# Patient Record
Sex: Male | Born: 1935
Health system: Southern US, Community
[De-identification: ages and names within clinical notes are randomized; demographics above are authoritative.]

## PROBLEM LIST (undated history)

## (undated) DIAGNOSIS — M545 Low back pain, unspecified: Secondary | ICD-10-CM

## (undated) DIAGNOSIS — C4490 Unspecified malignant neoplasm of skin, unspecified: Secondary | ICD-10-CM

## (undated) DIAGNOSIS — N419 Inflammatory disease of prostate, unspecified: Secondary | ICD-10-CM

## (undated) DIAGNOSIS — E7800 Pure hypercholesterolemia, unspecified: Secondary | ICD-10-CM

## (undated) DIAGNOSIS — N200 Calculus of kidney: Secondary | ICD-10-CM

## (undated) DIAGNOSIS — N400 Enlarged prostate without lower urinary tract symptoms: Secondary | ICD-10-CM

## (undated) DIAGNOSIS — I10 Essential (primary) hypertension: Secondary | ICD-10-CM

## (undated) DIAGNOSIS — G8929 Other chronic pain: Secondary | ICD-10-CM

## (undated) DIAGNOSIS — E119 Type 2 diabetes mellitus without complications: Secondary | ICD-10-CM

## (undated) HISTORY — PX: LITHOTRIPSY: SUR834

## (undated) HISTORY — PX: APPENDECTOMY: SHX54

## (undated) HISTORY — PX: LUMBAR DISC SURGERY: SHX700

## (undated) HISTORY — PX: BACK SURGERY: SHX140

## (undated) HISTORY — PX: FRACTURE SURGERY: SHX138

## (undated) HISTORY — PX: HYDROCELE EXCISION: SHX482

## (undated) HISTORY — PX: COLONOSCOPY W/ POLYPECTOMY: SHX1380

## (undated) HISTORY — PX: NASAL RECONSTRUCTION: SHX2069

## (undated) HISTORY — PX: KNEE ARTHROSCOPY: SUR90

---

## 2000-02-18 ENCOUNTER — Inpatient Hospital Stay (HOSPITAL_COMMUNITY): Admission: EM | Admit: 2000-02-18 | Discharge: 2000-02-27 | Payer: Self-pay | Admitting: Family Medicine

## 2000-02-18 ENCOUNTER — Encounter: Payer: Self-pay | Admitting: Emergency Medicine

## 2000-02-20 ENCOUNTER — Encounter: Payer: Self-pay | Admitting: General Surgery

## 2000-02-22 ENCOUNTER — Encounter: Payer: Self-pay | Admitting: General Surgery

## 2004-03-15 ENCOUNTER — Ambulatory Visit (HOSPITAL_COMMUNITY): Admission: RE | Admit: 2004-03-15 | Discharge: 2004-03-15 | Payer: Self-pay | Admitting: Gastroenterology

## 2005-07-26 ENCOUNTER — Encounter: Admission: RE | Admit: 2005-07-26 | Discharge: 2005-07-26 | Payer: Self-pay | Admitting: Orthopaedic Surgery

## 2005-08-12 ENCOUNTER — Encounter: Admission: RE | Admit: 2005-08-12 | Discharge: 2005-08-12 | Payer: Self-pay | Admitting: Orthopaedic Surgery

## 2008-09-21 ENCOUNTER — Emergency Department (HOSPITAL_COMMUNITY): Admission: EM | Admit: 2008-09-21 | Discharge: 2008-09-22 | Payer: Self-pay | Admitting: Emergency Medicine

## 2010-05-26 LAB — POCT I-STAT, CHEM 8
BUN: 27 mg/dL — ABNORMAL HIGH (ref 6–23)
Creatinine, Ser: 1.2 mg/dL (ref 0.4–1.5)
Hemoglobin: 15.3 g/dL (ref 13.0–17.0)
Potassium: 3.7 mEq/L (ref 3.5–5.1)
Sodium: 143 mEq/L (ref 135–145)

## 2011-06-19 DIAGNOSIS — E119 Type 2 diabetes mellitus without complications: Secondary | ICD-10-CM | POA: Diagnosis not present

## 2011-06-19 DIAGNOSIS — E785 Hyperlipidemia, unspecified: Secondary | ICD-10-CM | POA: Diagnosis not present

## 2011-06-19 DIAGNOSIS — I10 Essential (primary) hypertension: Secondary | ICD-10-CM | POA: Diagnosis not present

## 2011-07-30 DIAGNOSIS — H2510 Age-related nuclear cataract, unspecified eye: Secondary | ICD-10-CM | POA: Diagnosis not present

## 2011-07-30 DIAGNOSIS — H5200 Hypermetropia, unspecified eye: Secondary | ICD-10-CM | POA: Diagnosis not present

## 2011-11-01 DIAGNOSIS — E119 Type 2 diabetes mellitus without complications: Secondary | ICD-10-CM | POA: Diagnosis not present

## 2011-11-01 DIAGNOSIS — I10 Essential (primary) hypertension: Secondary | ICD-10-CM | POA: Diagnosis not present

## 2011-11-01 DIAGNOSIS — J309 Allergic rhinitis, unspecified: Secondary | ICD-10-CM | POA: Diagnosis not present

## 2011-11-01 DIAGNOSIS — Z23 Encounter for immunization: Secondary | ICD-10-CM | POA: Diagnosis not present

## 2012-09-01 DIAGNOSIS — H04129 Dry eye syndrome of unspecified lacrimal gland: Secondary | ICD-10-CM | POA: Diagnosis not present

## 2012-09-01 DIAGNOSIS — H2510 Age-related nuclear cataract, unspecified eye: Secondary | ICD-10-CM | POA: Diagnosis not present

## 2012-09-01 DIAGNOSIS — H02059 Trichiasis without entropian unspecified eye, unspecified eyelid: Secondary | ICD-10-CM | POA: Diagnosis not present

## 2012-10-13 DIAGNOSIS — M7040 Prepatellar bursitis, unspecified knee: Secondary | ICD-10-CM | POA: Diagnosis not present

## 2013-04-02 DIAGNOSIS — I10 Essential (primary) hypertension: Secondary | ICD-10-CM | POA: Diagnosis not present

## 2013-04-02 DIAGNOSIS — Z23 Encounter for immunization: Secondary | ICD-10-CM | POA: Diagnosis not present

## 2013-04-02 DIAGNOSIS — Z1331 Encounter for screening for depression: Secondary | ICD-10-CM | POA: Diagnosis not present

## 2013-04-02 DIAGNOSIS — E782 Mixed hyperlipidemia: Secondary | ICD-10-CM | POA: Diagnosis not present

## 2013-04-02 DIAGNOSIS — E119 Type 2 diabetes mellitus without complications: Secondary | ICD-10-CM | POA: Diagnosis not present

## 2013-10-01 DIAGNOSIS — Z1211 Encounter for screening for malignant neoplasm of colon: Secondary | ICD-10-CM | POA: Diagnosis not present

## 2013-10-01 DIAGNOSIS — E119 Type 2 diabetes mellitus without complications: Secondary | ICD-10-CM | POA: Diagnosis not present

## 2013-10-20 DIAGNOSIS — IMO0001 Reserved for inherently not codable concepts without codable children: Secondary | ICD-10-CM | POA: Diagnosis not present

## 2013-10-20 DIAGNOSIS — H2510 Age-related nuclear cataract, unspecified eye: Secondary | ICD-10-CM | POA: Diagnosis not present

## 2013-11-18 DIAGNOSIS — Z23 Encounter for immunization: Secondary | ICD-10-CM | POA: Diagnosis not present

## 2014-01-06 DIAGNOSIS — I10 Essential (primary) hypertension: Secondary | ICD-10-CM | POA: Diagnosis not present

## 2014-01-06 DIAGNOSIS — E119 Type 2 diabetes mellitus without complications: Secondary | ICD-10-CM | POA: Diagnosis not present

## 2014-01-31 DIAGNOSIS — J069 Acute upper respiratory infection, unspecified: Secondary | ICD-10-CM | POA: Diagnosis not present

## 2014-02-02 DIAGNOSIS — M25562 Pain in left knee: Secondary | ICD-10-CM | POA: Diagnosis not present

## 2014-04-08 DIAGNOSIS — J309 Allergic rhinitis, unspecified: Secondary | ICD-10-CM | POA: Diagnosis not present

## 2014-04-08 DIAGNOSIS — L570 Actinic keratosis: Secondary | ICD-10-CM | POA: Diagnosis not present

## 2014-04-08 DIAGNOSIS — Z0000 Encounter for general adult medical examination without abnormal findings: Secondary | ICD-10-CM | POA: Diagnosis not present

## 2014-04-08 DIAGNOSIS — Z125 Encounter for screening for malignant neoplasm of prostate: Secondary | ICD-10-CM | POA: Diagnosis not present

## 2014-04-08 DIAGNOSIS — I10 Essential (primary) hypertension: Secondary | ICD-10-CM | POA: Diagnosis not present

## 2014-04-08 DIAGNOSIS — E119 Type 2 diabetes mellitus without complications: Secondary | ICD-10-CM | POA: Diagnosis not present

## 2014-04-08 DIAGNOSIS — E782 Mixed hyperlipidemia: Secondary | ICD-10-CM | POA: Diagnosis not present

## 2014-04-08 DIAGNOSIS — D126 Benign neoplasm of colon, unspecified: Secondary | ICD-10-CM | POA: Diagnosis not present

## 2014-04-20 DIAGNOSIS — X32XXXA Exposure to sunlight, initial encounter: Secondary | ICD-10-CM | POA: Diagnosis not present

## 2014-04-20 DIAGNOSIS — D225 Melanocytic nevi of trunk: Secondary | ICD-10-CM | POA: Diagnosis not present

## 2014-04-20 DIAGNOSIS — L570 Actinic keratosis: Secondary | ICD-10-CM | POA: Diagnosis not present

## 2014-05-05 DIAGNOSIS — R972 Elevated prostate specific antigen [PSA]: Secondary | ICD-10-CM | POA: Diagnosis not present

## 2014-07-28 DIAGNOSIS — M25562 Pain in left knee: Secondary | ICD-10-CM | POA: Diagnosis not present

## 2014-07-28 DIAGNOSIS — M722 Plantar fascial fibromatosis: Secondary | ICD-10-CM | POA: Diagnosis not present

## 2014-07-29 ENCOUNTER — Other Ambulatory Visit: Payer: Self-pay | Admitting: Surgery

## 2014-07-29 DIAGNOSIS — M25562 Pain in left knee: Secondary | ICD-10-CM

## 2014-08-02 ENCOUNTER — Ambulatory Visit
Admission: RE | Admit: 2014-08-02 | Discharge: 2014-08-02 | Disposition: A | Discharge status: Home / Self Care | Payer: Medicare Other | Source: Ambulatory Visit | Attending: Surgery | Admitting: Surgery

## 2014-08-02 DIAGNOSIS — M7122 Synovial cyst of popliteal space [Baker], left knee: Secondary | ICD-10-CM | POA: Diagnosis not present

## 2014-08-02 DIAGNOSIS — M25562 Pain in left knee: Secondary | ICD-10-CM

## 2014-08-02 DIAGNOSIS — M23322 Other meniscus derangements, posterior horn of medial meniscus, left knee: Secondary | ICD-10-CM | POA: Diagnosis not present

## 2014-08-02 DIAGNOSIS — M7042 Prepatellar bursitis, left knee: Secondary | ICD-10-CM | POA: Diagnosis not present

## 2014-08-02 DIAGNOSIS — M23352 Other meniscus derangements, posterior horn of lateral meniscus, left knee: Secondary | ICD-10-CM | POA: Diagnosis not present

## 2014-08-05 DIAGNOSIS — M25562 Pain in left knee: Secondary | ICD-10-CM | POA: Diagnosis not present

## 2014-08-15 DIAGNOSIS — G8918 Other acute postprocedural pain: Secondary | ICD-10-CM | POA: Diagnosis not present

## 2014-08-15 DIAGNOSIS — S83242D Other tear of medial meniscus, current injury, left knee, subsequent encounter: Secondary | ICD-10-CM | POA: Diagnosis not present

## 2014-08-15 DIAGNOSIS — S83282D Other tear of lateral meniscus, current injury, left knee, subsequent encounter: Secondary | ICD-10-CM | POA: Diagnosis not present

## 2014-08-15 DIAGNOSIS — M2242 Chondromalacia patellae, left knee: Secondary | ICD-10-CM | POA: Diagnosis not present

## 2014-08-15 DIAGNOSIS — M23222 Derangement of posterior horn of medial meniscus due to old tear or injury, left knee: Secondary | ICD-10-CM | POA: Diagnosis not present

## 2014-08-15 DIAGNOSIS — M23242 Derangement of anterior horn of lateral meniscus due to old tear or injury, left knee: Secondary | ICD-10-CM | POA: Diagnosis not present

## 2014-09-06 DIAGNOSIS — Z125 Encounter for screening for malignant neoplasm of prostate: Secondary | ICD-10-CM | POA: Diagnosis not present

## 2014-09-06 DIAGNOSIS — R972 Elevated prostate specific antigen [PSA]: Secondary | ICD-10-CM | POA: Diagnosis not present

## 2014-09-06 DIAGNOSIS — E119 Type 2 diabetes mellitus without complications: Secondary | ICD-10-CM | POA: Diagnosis not present

## 2014-09-06 DIAGNOSIS — I10 Essential (primary) hypertension: Secondary | ICD-10-CM | POA: Diagnosis not present

## 2014-09-20 DIAGNOSIS — M545 Low back pain: Secondary | ICD-10-CM | POA: Diagnosis not present

## 2014-09-22 ENCOUNTER — Ambulatory Visit
Admission: RE | Admit: 2014-09-22 | Discharge: 2014-09-22 | Disposition: A | Discharge status: Home / Self Care | Payer: Medicare Other | Source: Ambulatory Visit | Attending: Orthopaedic Surgery | Admitting: Orthopaedic Surgery

## 2014-09-22 ENCOUNTER — Other Ambulatory Visit: Payer: Self-pay | Admitting: Orthopaedic Surgery

## 2014-09-22 DIAGNOSIS — M4727 Other spondylosis with radiculopathy, lumbosacral region: Secondary | ICD-10-CM | POA: Diagnosis not present

## 2014-09-22 DIAGNOSIS — M545 Low back pain: Secondary | ICD-10-CM

## 2014-09-22 DIAGNOSIS — M4806 Spinal stenosis, lumbar region: Secondary | ICD-10-CM | POA: Diagnosis not present

## 2014-09-22 DIAGNOSIS — M5137 Other intervertebral disc degeneration, lumbosacral region: Secondary | ICD-10-CM | POA: Diagnosis not present

## 2014-10-06 DIAGNOSIS — R972 Elevated prostate specific antigen [PSA]: Secondary | ICD-10-CM | POA: Diagnosis not present

## 2014-10-25 DIAGNOSIS — M545 Low back pain: Secondary | ICD-10-CM | POA: Diagnosis not present

## 2014-10-25 DIAGNOSIS — M4806 Spinal stenosis, lumbar region: Secondary | ICD-10-CM | POA: Diagnosis not present

## 2014-10-25 DIAGNOSIS — M4726 Other spondylosis with radiculopathy, lumbar region: Secondary | ICD-10-CM | POA: Diagnosis not present

## 2014-10-26 DIAGNOSIS — E119 Type 2 diabetes mellitus without complications: Secondary | ICD-10-CM | POA: Diagnosis not present

## 2014-10-26 DIAGNOSIS — H2513 Age-related nuclear cataract, bilateral: Secondary | ICD-10-CM | POA: Diagnosis not present

## 2014-10-31 ENCOUNTER — Encounter (HOSPITAL_COMMUNITY): Payer: Self-pay | Admitting: Emergency Medicine

## 2014-10-31 ENCOUNTER — Emergency Department (HOSPITAL_COMMUNITY): Payer: Medicare Other

## 2014-10-31 ENCOUNTER — Emergency Department (HOSPITAL_COMMUNITY)
Admission: EM | Admit: 2014-10-31 | Discharge: 2014-10-31 | Disposition: A | Discharge status: Home / Self Care | Payer: Medicare Other | Attending: Emergency Medicine | Admitting: Emergency Medicine

## 2014-10-31 DIAGNOSIS — N200 Calculus of kidney: Secondary | ICD-10-CM | POA: Diagnosis not present

## 2014-10-31 DIAGNOSIS — Z87891 Personal history of nicotine dependence: Secondary | ICD-10-CM | POA: Diagnosis not present

## 2014-10-31 DIAGNOSIS — I10 Essential (primary) hypertension: Secondary | ICD-10-CM | POA: Diagnosis not present

## 2014-10-31 DIAGNOSIS — E119 Type 2 diabetes mellitus without complications: Secondary | ICD-10-CM | POA: Insufficient documentation

## 2014-10-31 DIAGNOSIS — Z87438 Personal history of other diseases of male genital organs: Secondary | ICD-10-CM | POA: Insufficient documentation

## 2014-10-31 DIAGNOSIS — R110 Nausea: Secondary | ICD-10-CM | POA: Diagnosis not present

## 2014-10-31 DIAGNOSIS — R109 Unspecified abdominal pain: Secondary | ICD-10-CM | POA: Diagnosis present

## 2014-10-31 DIAGNOSIS — N132 Hydronephrosis with renal and ureteral calculous obstruction: Secondary | ICD-10-CM | POA: Diagnosis not present

## 2014-10-31 HISTORY — DX: Inflammatory disease of prostate, unspecified: N41.9

## 2014-10-31 HISTORY — DX: Essential (primary) hypertension: I10

## 2014-10-31 LAB — COMPREHENSIVE METABOLIC PANEL
ALT: 27 U/L (ref 17–63)
AST: 25 U/L (ref 15–41)
Albumin: 4.7 g/dL (ref 3.5–5.0)
Alkaline Phosphatase: 68 U/L (ref 38–126)
Anion gap: 7 (ref 5–15)
BILIRUBIN TOTAL: 0.6 mg/dL (ref 0.3–1.2)
BUN: 30 mg/dL — AB (ref 6–20)
CALCIUM: 9.3 mg/dL (ref 8.9–10.3)
CO2: 28 mmol/L (ref 22–32)
CREATININE: 1.16 mg/dL (ref 0.61–1.24)
Chloride: 108 mmol/L (ref 101–111)
GFR, EST NON AFRICAN AMERICAN: 59 mL/min — AB (ref >60)
Glucose, Bld: 133 mg/dL — ABNORMAL HIGH (ref 65–99)
Potassium: 4.8 mmol/L (ref 3.5–5.1)
Sodium: 143 mmol/L (ref 135–145)
TOTAL PROTEIN: 7.7 g/dL (ref 6.5–8.1)

## 2014-10-31 LAB — URINALYSIS, ROUTINE W REFLEX MICROSCOPIC
BILIRUBIN URINE: NEGATIVE
Glucose, UA: NEGATIVE mg/dL
KETONES UR: NEGATIVE mg/dL
LEUKOCYTES UA: NEGATIVE
NITRITE: NEGATIVE
PH: 5 (ref 5.0–8.0)
PROTEIN: NEGATIVE mg/dL
Specific Gravity, Urine: 1.029 (ref 1.005–1.030)
UROBILINOGEN UA: 0.2 mg/dL (ref 0.0–1.0)

## 2014-10-31 LAB — CBC
HCT: 42.8 % (ref 39.0–52.0)
Hemoglobin: 14.1 g/dL (ref 13.0–17.0)
MCH: 29.4 pg (ref 26.0–34.0)
MCHC: 32.9 g/dL (ref 30.0–36.0)
MCV: 89.2 fL (ref 78.0–100.0)
PLATELETS: 201 10*3/uL (ref 150–400)
RBC: 4.8 MIL/uL (ref 4.22–5.81)
RDW: 13.3 % (ref 11.5–15.5)
WBC: 9.9 10*3/uL (ref 4.0–10.5)

## 2014-10-31 LAB — URINE MICROSCOPIC-ADD ON

## 2014-10-31 LAB — LIPASE, BLOOD: Lipase: 42 U/L (ref 22–51)

## 2014-10-31 MED ORDER — HYDROCODONE-ACETAMINOPHEN 5-325 MG PO TABS
2.0000 | ORAL_TABLET | Freq: Once | ORAL | Status: AC
  Administered 2014-10-31: 2 via ORAL
  Filled 2014-10-31: qty 2

## 2014-10-31 MED ORDER — HYDROCODONE-ACETAMINOPHEN 5-325 MG PO TABS: 1.0000 | ORAL_TABLET | ORAL | Status: DC | PRN

## 2014-10-31 MED ORDER — TAMSULOSIN HCL 0.4 MG PO CAPS: 0.4000 mg | ORAL_CAPSULE | Freq: Every day | ORAL | Status: DC

## 2014-10-31 NOTE — ED Provider Notes (Signed)
CSN: [id number]     Arrival date & time 10/31/14  1729 History   First MD Initiated Contact with Patient 10/31/14 2040     Chief Complaint  Patient presents with  . Abdominal Pain     (Consider location/radiation/quality/duration/timing/severity/associated sxs/prior Treatment) Patient is a 79 y.o. male presenting with abdominal pain. The history is provided by the patient and the spouse.  Abdominal Pain Pain location:  L flank Pain quality: stabbing   Pain radiates to:  Does not radiate Pain severity:  Moderate Onset quality:  Sudden Duration:  1 day Timing:  Intermittent Progression:  Waxing and waning Chronicity:  New Relieved by:  Nothing Worsened by:  Nothing tried Ineffective treatments:  NSAIDs Associated symptoms: nausea   Associated symptoms: no fever, no hematuria and no vomiting     Past Medical History  Diagnosis Date  . Hypertension   . Diabetes mellitus without complication   . Prostate infection    History reviewed. No pertinent past surgical history. History reviewed. No pertinent family history. Social History  Substance Use Topics  . Smoking status: Former Smoker    Types: Cigarettes  . Smokeless tobacco: None  . Alcohol Use: No    Review of Systems  Constitutional: Negative for fever.  Gastrointestinal: Positive for nausea and abdominal pain. Negative for vomiting.  Genitourinary: Negative for hematuria.  All other systems reviewed and are negative.     Allergies  Review of patient's allergies indicates no known allergies.  Home Medications   Prior to Admission medications   Not on File   BP 160/90 mmHg  Pulse 69  Temp(Src) 97.7 F (36.5 C) (Oral)  Resp 18  SpO2 96% Physical Exam  Constitutional: He is oriented to person, place, and time. He appears well-developed and well-nourished.  Non-toxic appearance. No distress.  HENT:  Head: Normocephalic and atraumatic.  Eyes: Conjunctivae, EOM and lids are normal. Pupils are equal,  round, and reactive to light.  Neck: Normal range of motion. Neck supple. No tracheal deviation present. No thyroid mass present.  Cardiovascular: Normal rate, regular rhythm and normal heart sounds.  Exam reveals no gallop.   No murmur heard. Pulmonary/Chest: Effort normal and breath sounds normal. No stridor. No respiratory distress. He has no decreased breath sounds. He has no wheezes. He has no rhonchi. He has no rales.  Abdominal: Soft. Normal appearance and bowel sounds are normal. He exhibits no distension. There is no tenderness. There is no rebound and no CVA tenderness.  Musculoskeletal: Normal range of motion. He exhibits no edema or tenderness.  Neurological: He is alert and oriented to person, place, and time. He has normal strength. No cranial nerve deficit or sensory deficit. GCS eye subscore is 4. GCS verbal subscore is 5. GCS motor subscore is 6.  Skin: Skin is warm and dry. No abrasion and no rash noted.  Psychiatric: He has a normal mood and affect. His speech is normal and behavior is normal.  Nursing note and vitals reviewed.   ED Course  Procedures (including critical care time) Labs Review Labs Reviewed  COMPREHENSIVE METABOLIC PANEL - Abnormal; Notable for the following:    Glucose, Bld 133 (*)    BUN 30 (*)    GFR calc non Af Amer 59 (*)    All other components within normal limits  URINALYSIS, ROUTINE W REFLEX MICROSCOPIC (NOT AT [facility]) - Abnormal; Notable for the following:    APPearance CLOUDY (*)    Hgb urine dipstick MODERATE (*)  All other components within normal limits  URINE MICROSCOPIC-ADD ON - Abnormal; Notable for the following:    Casts HYALINE CASTS (*)    All other components within normal limits  LIPASE, BLOOD  CBC    Imaging Review No results found. I have personally reviewed and evaluated these images and lab results as part of my medical decision-making.   EKG Interpretation None      MDM   Final diagnoses:  None    Patient  given Vicodin feels better. Has an appointment to be seen by urology on the fifth of . Instructed to call the office to see if he can be seen sooner.    Lacretia Leigh, MD 10/31/14 2248

## 2014-10-31 NOTE — ED Notes (Signed)
Pt c/o left side and abdominal pain onset today at 0400, has been treating with advil which helps mildly. Denies dysuria or urinary symptoms. Pt states he has ongoing prostate infection as well.

## 2014-10-31 NOTE — ED Notes (Signed)
Starting around 0400 pt began feeling a radiating dull pain in his left abdominal area. He states he had 1 episode of emesis, but denies diarrhea. Pt has a hx of a hernia. Pt states the pain comes in waves. His abdomen is tight and appears distended. Pt states he is able to tolerate PO fluids and food. Pt rates his pain at 8/10

## 2014-10-31 NOTE — Progress Notes (Signed)
EDCM spoke to patient at bedside.  Patient confirms his pcp is Dr. Hulan Fess.  System updated.

## 2014-10-31 NOTE — Discharge Instructions (Signed)
Called the urologist tomorrow to schedule a follow-up visit. Tell him that you in the ER and that you have a 3 mm kidney stone on the left side Kidney Stones Kidney stones (urolithiasis) are deposits that form inside your kidneys. The intense pain is caused by the stone moving through the urinary tract. When the stone moves, the ureter goes into spasm around the stone. The stone is usually passed in the urine.  CAUSES   A disorder that makes certain neck glands produce too much parathyroid hormone (primary hyperparathyroidism).  A buildup of uric acid crystals, similar to gout in your joints.  Narrowing (stricture) of the ureter.  A kidney obstruction present at birth (congenital obstruction).  Previous surgery on the kidney or ureters.  Numerous kidney infections. SYMPTOMS   Feeling sick to your stomach (nauseous).  Throwing up (vomiting).  Blood in the urine (hematuria).  Pain that usually spreads (radiates) to the groin.  Frequency or urgency of urination. DIAGNOSIS   Taking a history and physical exam.  Blood or urine tests.  CT scan.  Occasionally, an examination of the inside of the urinary bladder (cystoscopy) is performed. TREATMENT   Observation.  Increasing your fluid intake.  Extracorporeal shock wave lithotripsy--This is a noninvasive procedure that uses shock waves to break up kidney stones.  Surgery may be needed if you have severe pain or persistent obstruction. There are various surgical procedures. Most of the procedures are performed with the use of small instruments. Only small incisions are needed to accommodate these instruments, so recovery time is minimized. The size, location, and chemical composition are all important variables that will determine the proper choice of action for you. Talk to your health care provider to better understand your situation so that you will minimize the risk of injury to yourself and your kidney.  HOME CARE  INSTRUCTIONS   Drink enough water and fluids to keep your urine clear or pale yellow. This will help you to pass the stone or stone fragments.  Strain all urine through the provided strainer. Keep all particulate matter and stones for your health care provider to see. The stone causing the pain may be as small as a grain of salt. It is very important to use the strainer each and every time you pass your urine. The collection of your stone will allow your health care provider to analyze it and verify that a stone has actually passed. The stone analysis will often identify what you can do to reduce the incidence of recurrences.  Only take over-the-counter or prescription medicines for pain, discomfort, or fever as directed by your health care provider.  Make a follow-up appointment with your health care provider as directed.  Get follow-up X-rays if required. The absence of pain does not always mean that the stone has passed. It may have only stopped moving. If the urine remains completely obstructed, it can cause loss of kidney function or even complete destruction of the kidney. It is your responsibility to make sure X-rays and follow-ups are completed. Ultrasounds of the kidney can show blockages and the status of the kidney. Ultrasounds are not associated with any radiation and can be performed easily in a matter of minutes. SEEK MEDICAL CARE IF:  You experience pain that is progressive and unresponsive to any pain medicine you have been prescribed. SEEK IMMEDIATE MEDICAL CARE IF:   Pain cannot be controlled with the prescribed medicine.  You have a fever or shaking chills.  The severity or intensity  of pain increases over 18 hours and is not relieved by pain medicine.  You develop a new onset of abdominal pain.  You feel faint or pass out.  You are unable to urinate. MAKE SURE YOU:   Understand these instructions.  Will watch your condition.  Will get help right away if you are  not doing well or get worse. Document Released: 02/04/2005 Document Revised: 10/07/2012 Document Reviewed: 07/08/2012 [facility] Patient Information 2015 [place], [place]. This information is not intended to replace advice given to you by your health care provider. Make sure you discuss any questions you have with your health care provider.

## 2014-11-15 DIAGNOSIS — Z87442 Personal history of urinary calculi: Secondary | ICD-10-CM | POA: Diagnosis not present

## 2014-11-15 DIAGNOSIS — N400 Enlarged prostate without lower urinary tract symptoms: Secondary | ICD-10-CM | POA: Diagnosis not present

## 2014-11-15 DIAGNOSIS — R972 Elevated prostate specific antigen [PSA]: Secondary | ICD-10-CM | POA: Diagnosis not present

## 2015-01-18 DIAGNOSIS — N400 Enlarged prostate without lower urinary tract symptoms: Secondary | ICD-10-CM | POA: Diagnosis not present

## 2015-01-18 DIAGNOSIS — Z87442 Personal history of urinary calculi: Secondary | ICD-10-CM | POA: Diagnosis not present

## 2015-01-25 DIAGNOSIS — N400 Enlarged prostate without lower urinary tract symptoms: Secondary | ICD-10-CM | POA: Diagnosis not present

## 2015-01-25 DIAGNOSIS — R972 Elevated prostate specific antigen [PSA]: Secondary | ICD-10-CM | POA: Diagnosis not present

## 2015-01-25 DIAGNOSIS — N481 Balanitis: Secondary | ICD-10-CM | POA: Diagnosis not present

## 2015-01-25 DIAGNOSIS — Z87442 Personal history of urinary calculi: Secondary | ICD-10-CM | POA: Diagnosis not present

## 2015-02-06 DIAGNOSIS — L739 Follicular disorder, unspecified: Secondary | ICD-10-CM | POA: Diagnosis not present

## 2015-02-06 DIAGNOSIS — Z23 Encounter for immunization: Secondary | ICD-10-CM | POA: Diagnosis not present

## 2015-04-14 DIAGNOSIS — M4806 Spinal stenosis, lumbar region: Secondary | ICD-10-CM | POA: Diagnosis not present

## 2015-04-14 DIAGNOSIS — M545 Low back pain: Secondary | ICD-10-CM | POA: Diagnosis not present

## 2015-04-14 DIAGNOSIS — M4726 Other spondylosis with radiculopathy, lumbar region: Secondary | ICD-10-CM | POA: Diagnosis not present

## 2015-04-26 DIAGNOSIS — M4806 Spinal stenosis, lumbar region: Secondary | ICD-10-CM | POA: Diagnosis not present

## 2015-04-26 DIAGNOSIS — E782 Mixed hyperlipidemia: Secondary | ICD-10-CM | POA: Diagnosis not present

## 2015-04-26 DIAGNOSIS — I10 Essential (primary) hypertension: Secondary | ICD-10-CM | POA: Diagnosis not present

## 2015-04-26 DIAGNOSIS — R972 Elevated prostate specific antigen [PSA]: Secondary | ICD-10-CM | POA: Diagnosis not present

## 2015-04-26 DIAGNOSIS — Z0000 Encounter for general adult medical examination without abnormal findings: Secondary | ICD-10-CM | POA: Diagnosis not present

## 2015-04-26 DIAGNOSIS — L570 Actinic keratosis: Secondary | ICD-10-CM | POA: Diagnosis not present

## 2015-04-26 DIAGNOSIS — E119 Type 2 diabetes mellitus without complications: Secondary | ICD-10-CM | POA: Diagnosis not present

## 2015-04-26 DIAGNOSIS — D126 Benign neoplasm of colon, unspecified: Secondary | ICD-10-CM | POA: Diagnosis not present

## 2015-05-05 DIAGNOSIS — L570 Actinic keratosis: Secondary | ICD-10-CM | POA: Diagnosis not present

## 2015-05-05 DIAGNOSIS — X32XXXD Exposure to sunlight, subsequent encounter: Secondary | ICD-10-CM | POA: Diagnosis not present

## 2015-05-05 DIAGNOSIS — D225 Melanocytic nevi of trunk: Secondary | ICD-10-CM | POA: Diagnosis not present

## 2015-05-08 ENCOUNTER — Other Ambulatory Visit: Payer: Self-pay | Admitting: Orthopaedic Surgery

## 2015-05-16 ENCOUNTER — Ambulatory Visit (HOSPITAL_COMMUNITY)
Admission: RE | Admit: 2015-05-16 | Discharge: 2015-05-16 | Disposition: A | Discharge status: Home / Self Care | Payer: Medicare Other | Source: Ambulatory Visit | Attending: Orthopaedic Surgery | Admitting: Orthopaedic Surgery

## 2015-05-16 ENCOUNTER — Encounter (HOSPITAL_COMMUNITY)
Admission: RE | Admit: 2015-05-16 | Discharge: 2015-05-16 | Disposition: A | Discharge status: Home / Self Care | Payer: Medicare Other | Source: Ambulatory Visit | Attending: Orthopaedic Surgery | Admitting: Orthopaedic Surgery

## 2015-05-16 ENCOUNTER — Encounter (HOSPITAL_COMMUNITY): Payer: Self-pay

## 2015-05-16 DIAGNOSIS — J984 Other disorders of lung: Secondary | ICD-10-CM | POA: Diagnosis not present

## 2015-05-16 DIAGNOSIS — Z01812 Encounter for preprocedural laboratory examination: Secondary | ICD-10-CM | POA: Diagnosis not present

## 2015-05-16 DIAGNOSIS — M4806 Spinal stenosis, lumbar region: Secondary | ICD-10-CM | POA: Diagnosis not present

## 2015-05-16 DIAGNOSIS — M48061 Spinal stenosis, lumbar region without neurogenic claudication: Secondary | ICD-10-CM

## 2015-05-16 DIAGNOSIS — E119 Type 2 diabetes mellitus without complications: Secondary | ICD-10-CM | POA: Insufficient documentation

## 2015-05-16 DIAGNOSIS — R918 Other nonspecific abnormal finding of lung field: Secondary | ICD-10-CM | POA: Diagnosis not present

## 2015-05-16 DIAGNOSIS — M545 Low back pain: Secondary | ICD-10-CM | POA: Diagnosis present

## 2015-05-16 DIAGNOSIS — Z87891 Personal history of nicotine dependence: Secondary | ICD-10-CM | POA: Insufficient documentation

## 2015-05-16 DIAGNOSIS — Z79899 Other long term (current) drug therapy: Secondary | ICD-10-CM | POA: Insufficient documentation

## 2015-05-16 DIAGNOSIS — Z7984 Long term (current) use of oral hypoglycemic drugs: Secondary | ICD-10-CM | POA: Diagnosis not present

## 2015-05-16 DIAGNOSIS — I10 Essential (primary) hypertension: Secondary | ICD-10-CM | POA: Insufficient documentation

## 2015-05-16 HISTORY — DX: Benign prostatic hyperplasia without lower urinary tract symptoms: N40.0

## 2015-05-16 LAB — COMPREHENSIVE METABOLIC PANEL
ALK PHOS: 69 U/L (ref 38–126)
ALT: 25 U/L (ref 17–63)
ANION GAP: 8 (ref 5–15)
AST: 23 U/L (ref 15–41)
Albumin: 3.7 g/dL (ref 3.5–5.0)
BILIRUBIN TOTAL: 0.6 mg/dL (ref 0.3–1.2)
BUN: 19 mg/dL (ref 6–20)
CALCIUM: 8.8 mg/dL — AB (ref 8.9–10.3)
CO2: 25 mmol/L (ref 22–32)
Chloride: 107 mmol/L (ref 101–111)
Creatinine, Ser: 0.92 mg/dL (ref 0.61–1.24)
Glucose, Bld: 123 mg/dL — ABNORMAL HIGH (ref 65–99)
Potassium: 4.4 mmol/L (ref 3.5–5.1)
Sodium: 140 mmol/L (ref 135–145)
TOTAL PROTEIN: 6.8 g/dL (ref 6.5–8.1)

## 2015-05-16 LAB — GLUCOSE, CAPILLARY: GLUCOSE-CAPILLARY: 106 mg/dL — AB (ref 65–99)

## 2015-05-16 LAB — PROTIME-INR
INR: 1.03 (ref 0.00–1.49)
PROTHROMBIN TIME: 13.7 s (ref 11.6–15.2)

## 2015-05-16 LAB — CBC
HEMATOCRIT: 43.6 % (ref 39.0–52.0)
HEMOGLOBIN: 14.1 g/dL (ref 13.0–17.0)
MCH: 29.4 pg (ref 26.0–34.0)
MCHC: 32.3 g/dL (ref 30.0–36.0)
MCV: 90.8 fL (ref 78.0–100.0)
Platelets: 194 10*3/uL (ref 150–400)
RBC: 4.8 MIL/uL (ref 4.22–5.81)
RDW: 13.1 % (ref 11.5–15.5)
WBC: 7.2 10*3/uL (ref 4.0–10.5)

## 2015-05-16 LAB — SURGICAL PCR SCREEN
MRSA, PCR: POSITIVE — AB
Staphylococcus aureus: POSITIVE — AB

## 2015-05-16 NOTE — Pre-Procedure Instructions (Signed)
Seth Foley  05/16/2015      Your procedure is scheduled on Friday, March 31  Report to [facility] Admitting at 7:50 A.M.               Your surgery or procedure is scheduled for 9:50 AM   Call this number if you have problems the morning of surgery:[phone]                For any other questions, please call [phone], Monday - Friday 8 AM - 4 PM.             Remember:  Do not eat food or drink liquids after midnight on Thrusday,March 30   Take these medicines the morning of surgery with A SIP OF WATER: hydrocodone,systane drops if needed              Stop ibuprofen, advil, motrin, aleve today   How to Manage Your Diabetes Before and After Surgery  WHAT DO I DO ABOUT MY DIABETES MEDICATION?  Marland Kitchen Do not take oral diabetes medicines (pills) the morning of surgery.  Why is it important to control my blood sugar before and after surgery? . Improving blood sugar levels before and after surgery helps healing and can limit problems. . A way of improving blood sugar control is eating a healthy diet by: o  Eating less sugar and carbohydrates o  Increasing activity/exercise o  Talking with your doctor about reaching your blood sugar goals . High blood sugars (greater than 180 mg/dL) can raise your risk of infections and slow your recovery, so you will need to focus on controlling your diabetes during the weeks before surgery. . Make sure that the doctor who takes care of your diabetes knows about your planned surgery including the date and location.  How do I manage my blood sugar before surgery? . Check your blood sugar at least 4 times a day, starting 2 days before surgery, to make sure that the level is not too high or low o Check your blood sugar the morning of your surgery when you wake up and every 2 hours until you get to the Short Stay unit. . If your blood sugar is less than 70 mg/dL, you will need to treat for low blood sugar: o Do not take  insulin. o Treat a low blood sugar (less than 70 mg/dL) with  cup of clear juice (cranberry or apple), 4 glucose tablets, OR glucose gel. o Recheck blood sugar in 15 minutes after treatment (to make sure it is greater than 70 mg/dL). If your blood sugar is not greater than 70 mg/dL on recheck, call [phone] for further instructions. . Report your blood sugar to the short stay nurse when you get to Short Stay.  . If you are admitted to the hospital after surgery: o Your blood sugar will be checked by the staff and you will probably be given insulin after surgery (instead of oral diabetes medicines) to make sure you have good blood sugar levels. o The goal for blood sugar control after surgery is 80-180.   Do not wear jewelry, make-up or nail polish.  Do not wear lotions, powders, or perfumes.  You may wear deodorant.  Do not shave 48 hours prior to surgery.  Men may shave face and neck.  Do not bring valuables to the hospital.  [facility] is not responsible for any belongings or valuables.  Contacts, dentures or bridgework may not be  worn into surgery.  Leave your suitcase in the car.  After surgery it may be brought to your room.  For patients admitted to the hospital, discharge time will be determined by your treatment team.  Special instructions: Review  [facility] - Preparing For Surgery.  Please read over the following fact sheets that you were given. Pain Booklet, Coughing and Deep Breathing and Surgical Site Infection Prevention

## 2015-05-16 NOTE — Progress Notes (Signed)
Mr Larios denies chest pain or any history of heart disease, does not see a cardiologist.  PCP is Dr Hulan Fess.  Patient reports that he had an EKG and Labs drawn earlier in March.  Patient reports that A1C was 6. Something, "I know it was lower than 7".  I will request records from Dr Eddie Dibbles office.

## 2015-05-19 ENCOUNTER — Encounter (HOSPITAL_COMMUNITY)
Admission: RE | Disposition: A | Discharge status: Home / Self Care | Payer: Self-pay | Source: Ambulatory Visit | Attending: Orthopaedic Surgery

## 2015-05-19 ENCOUNTER — Inpatient Hospital Stay (HOSPITAL_COMMUNITY): Payer: Medicare Other | Admitting: Certified Registered"

## 2015-05-19 ENCOUNTER — Inpatient Hospital Stay (HOSPITAL_COMMUNITY): Payer: Medicare Other

## 2015-05-19 ENCOUNTER — Observation Stay (HOSPITAL_COMMUNITY)
Admission: RE | Admit: 2015-05-19 | Discharge: 2015-05-20 | Disposition: A | Discharge status: Home / Self Care | Payer: Medicare Other | Source: Ambulatory Visit | Attending: Orthopaedic Surgery | Admitting: Orthopaedic Surgery

## 2015-05-19 ENCOUNTER — Encounter (HOSPITAL_COMMUNITY): Payer: Self-pay | Admitting: *Deleted

## 2015-05-19 DIAGNOSIS — Z85828 Personal history of other malignant neoplasm of skin: Secondary | ICD-10-CM | POA: Insufficient documentation

## 2015-05-19 DIAGNOSIS — I10 Essential (primary) hypertension: Secondary | ICD-10-CM | POA: Diagnosis not present

## 2015-05-19 DIAGNOSIS — Z885 Allergy status to narcotic agent status: Secondary | ICD-10-CM | POA: Diagnosis not present

## 2015-05-19 DIAGNOSIS — Z87891 Personal history of nicotine dependence: Secondary | ICD-10-CM | POA: Diagnosis not present

## 2015-05-19 DIAGNOSIS — Z87442 Personal history of urinary calculi: Secondary | ICD-10-CM | POA: Insufficient documentation

## 2015-05-19 DIAGNOSIS — N400 Enlarged prostate without lower urinary tract symptoms: Secondary | ICD-10-CM | POA: Diagnosis not present

## 2015-05-19 DIAGNOSIS — Z79899 Other long term (current) drug therapy: Secondary | ICD-10-CM | POA: Insufficient documentation

## 2015-05-19 DIAGNOSIS — Z9889 Other specified postprocedural states: Secondary | ICD-10-CM

## 2015-05-19 DIAGNOSIS — Z419 Encounter for procedure for purposes other than remedying health state, unspecified: Secondary | ICD-10-CM

## 2015-05-19 DIAGNOSIS — M5136 Other intervertebral disc degeneration, lumbar region: Principal | ICD-10-CM | POA: Insufficient documentation

## 2015-05-19 DIAGNOSIS — M4806 Spinal stenosis, lumbar region: Secondary | ICD-10-CM | POA: Insufficient documentation

## 2015-05-19 DIAGNOSIS — Z7984 Long term (current) use of oral hypoglycemic drugs: Secondary | ICD-10-CM | POA: Insufficient documentation

## 2015-05-19 DIAGNOSIS — E119 Type 2 diabetes mellitus without complications: Secondary | ICD-10-CM | POA: Diagnosis not present

## 2015-05-19 DIAGNOSIS — M545 Low back pain: Secondary | ICD-10-CM | POA: Diagnosis not present

## 2015-05-19 HISTORY — PX: DECOMPRESSIVE LUMBAR LAMINECTOMY LEVEL 2: SHX5792

## 2015-05-19 HISTORY — DX: Pure hypercholesterolemia, unspecified: E78.00

## 2015-05-19 HISTORY — DX: Low back pain: M54.5

## 2015-05-19 HISTORY — DX: Calculus of kidney: N20.0

## 2015-05-19 HISTORY — DX: Unspecified malignant neoplasm of skin, unspecified: C44.90

## 2015-05-19 HISTORY — DX: Type 2 diabetes mellitus without complications: E11.9

## 2015-05-19 HISTORY — DX: Low back pain, unspecified: M54.50

## 2015-05-19 HISTORY — DX: Other chronic pain: G89.29

## 2015-05-19 HISTORY — PX: LUMBAR LAMINECTOMY/DECOMPRESSION MICRODISCECTOMY: SHX5026

## 2015-05-19 LAB — GLUCOSE, CAPILLARY
GLUCOSE-CAPILLARY: 137 mg/dL — AB (ref 65–99)
Glucose-Capillary: 131 mg/dL — ABNORMAL HIGH (ref 65–99)
Glucose-Capillary: 137 mg/dL — ABNORMAL HIGH (ref 65–99)
Glucose-Capillary: 126 mg/dL — ABNORMAL HIGH (ref 65–99)

## 2015-05-19 SURGERY — LUMBAR LAMINECTOMY/DECOMPRESSION MICRODISCECTOMY
Anesthesia: General

## 2015-05-19 MED ORDER — CEFAZOLIN SODIUM 1-5 GM-% IV SOLN
1.0000 g | Freq: Three times a day (TID) | INTRAVENOUS | Status: AC
  Administered 2015-05-20: 1 g via INTRAVENOUS
  Filled 2015-05-19 (×2): qty 50

## 2015-05-19 MED ORDER — FENTANYL CITRATE (PF) 100 MCG/2ML IJ SOLN
INTRAMUSCULAR | Status: DC | PRN
  Administered 2015-05-19: 50 ug via INTRAVENOUS
  Administered 2015-05-19 (×2): 25 ug via INTRAVENOUS
  Administered 2015-05-19: 150 ug via INTRAVENOUS

## 2015-05-19 MED ORDER — METFORMIN HCL 500 MG PO TABS
500.0000 mg | ORAL_TABLET | Freq: Every day | ORAL | Status: DC
  Administered 2015-05-20: 500 mg via ORAL
  Filled 2015-05-19: qty 1

## 2015-05-19 MED ORDER — POLYETHYLENE GLYCOL 3350 17 G PO PACK: 17.0000 g | PACK | Freq: Every day | ORAL | Status: DC | PRN

## 2015-05-19 MED ORDER — LACTATED RINGERS IV SOLN
INTRAVENOUS | Status: DC
  Administered 2015-05-19 (×3): via INTRAVENOUS

## 2015-05-19 MED ORDER — GLYCOPYRROLATE 0.2 MG/ML IJ SOLN
INTRAMUSCULAR | Status: DC | PRN
  Administered 2015-05-19: 0.6 mg via INTRAVENOUS

## 2015-05-19 MED ORDER — SODIUM CHLORIDE 0.9 % IV SOLN: 250.0000 mL | INTRAVENOUS | Status: DC

## 2015-05-19 MED ORDER — ONDANSETRON HCL 4 MG/2ML IJ SOLN
INTRAMUSCULAR | Status: DC | PRN
  Administered 2015-05-19: 4 mg via INTRAVENOUS

## 2015-05-19 MED ORDER — METHOCARBAMOL 500 MG PO TABS: 500.0000 mg | ORAL_TABLET | Freq: Four times a day (QID) | ORAL | Status: DC | PRN

## 2015-05-19 MED ORDER — OXYCODONE-ACETAMINOPHEN 5-325 MG PO TABS
ORAL_TABLET | ORAL | Status: AC
  Filled 2015-05-19: qty 2

## 2015-05-19 MED ORDER — FENTANYL CITRATE (PF) 250 MCG/5ML IJ SOLN
INTRAMUSCULAR | Status: AC
  Filled 2015-05-19: qty 5

## 2015-05-19 MED ORDER — HYDROMORPHONE HCL 1 MG/ML IJ SOLN: 0.5000 mg | INTRAMUSCULAR | Status: DC | PRN

## 2015-05-19 MED ORDER — ROCURONIUM BROMIDE 50 MG/5ML IV SOLN
INTRAVENOUS | Status: AC
  Filled 2015-05-19: qty 1

## 2015-05-19 MED ORDER — VANCOMYCIN HCL IN DEXTROSE 1-5 GM/200ML-% IV SOLN
1000.0000 mg | Freq: Once | INTRAVENOUS | Status: AC
  Administered 2015-05-19: 1000 mg via INTRAVENOUS

## 2015-05-19 MED ORDER — MEPERIDINE HCL 25 MG/ML IJ SOLN: 6.2500 mg | INTRAMUSCULAR | Status: DC | PRN

## 2015-05-19 MED ORDER — LOSARTAN POTASSIUM 50 MG PO TABS
100.0000 mg | ORAL_TABLET | Freq: Every day | ORAL | Status: DC
  Administered 2015-05-19 – 2015-05-20 (×2): 100 mg via ORAL
  Filled 2015-05-19: qty 2

## 2015-05-19 MED ORDER — ARTIFICIAL TEARS OP OINT
TOPICAL_OINTMENT | OPHTHALMIC | Status: DC | PRN
  Administered 2015-05-19: 1 via OPHTHALMIC

## 2015-05-19 MED ORDER — DOCUSATE SODIUM 100 MG PO CAPS
100.0000 mg | ORAL_CAPSULE | Freq: Two times a day (BID) | ORAL | Status: DC
  Administered 2015-05-19 – 2015-05-20 (×2): 100 mg via ORAL
  Filled 2015-05-19 (×2): qty 1

## 2015-05-19 MED ORDER — NEOSTIGMINE METHYLSULFATE 10 MG/10ML IV SOLN
INTRAVENOUS | Status: AC
  Filled 2015-05-19: qty 1

## 2015-05-19 MED ORDER — PHENYLEPHRINE 40 MCG/ML (10ML) SYRINGE FOR IV PUSH (FOR BLOOD PRESSURE SUPPORT)
PREFILLED_SYRINGE | INTRAVENOUS | Status: AC
  Filled 2015-05-19: qty 10

## 2015-05-19 MED ORDER — ONDANSETRON HCL 4 MG/2ML IJ SOLN
4.0000 mg | Freq: Once | INTRAMUSCULAR | Status: DC | PRN
Start: 2015-05-19 — End: 2015-05-19

## 2015-05-19 MED ORDER — METHOCARBAMOL 1000 MG/10ML IJ SOLN
500.0000 mg | Freq: Four times a day (QID) | INTRAVENOUS | Status: DC | PRN
  Filled 2015-05-19: qty 5

## 2015-05-19 MED ORDER — SIMVASTATIN 20 MG PO TABS
20.0000 mg | ORAL_TABLET | Freq: Every day | ORAL | Status: DC
Start: 2015-05-19 — End: 2015-05-20
  Administered 2015-05-19: 20 mg via ORAL
  Filled 2015-05-19: qty 1

## 2015-05-19 MED ORDER — LIDOCAINE HCL (CARDIAC) 20 MG/ML IV SOLN
INTRAVENOUS | Status: AC
  Filled 2015-05-19: qty 5

## 2015-05-19 MED ORDER — ARTIFICIAL TEARS OP OINT
TOPICAL_OINTMENT | OPHTHALMIC | Status: AC
  Filled 2015-05-19: qty 3.5

## 2015-05-19 MED ORDER — KETOROLAC TROMETHAMINE 30 MG/ML IJ SOLN
INTRAMUSCULAR | Status: AC
  Filled 2015-05-19: qty 1

## 2015-05-19 MED ORDER — LIDOCAINE HCL (CARDIAC) 20 MG/ML IV SOLN
INTRAVENOUS | Status: DC | PRN
  Administered 2015-05-19: 100 mg via INTRAVENOUS

## 2015-05-19 MED ORDER — ROCURONIUM BROMIDE 100 MG/10ML IV SOLN
INTRAVENOUS | Status: DC | PRN
  Administered 2015-05-19: 40 mg via INTRAVENOUS

## 2015-05-19 MED ORDER — MENTHOL 3 MG MT LOZG: 1.0000 | LOZENGE | OROMUCOSAL | Status: DC | PRN

## 2015-05-19 MED ORDER — KETOROLAC TROMETHAMINE 30 MG/ML IJ SOLN
30.0000 mg | Freq: Once | INTRAMUSCULAR | Status: AC
Start: 2015-05-19 — End: 2015-05-19
  Administered 2015-05-19: 30 mg via INTRAVENOUS

## 2015-05-19 MED ORDER — ACETAMINOPHEN 650 MG RE SUPP: 650.0000 mg | RECTAL | Status: DC | PRN

## 2015-05-19 MED ORDER — BUPIVACAINE HCL (PF) 0.25 % IJ SOLN
INTRAMUSCULAR | Status: AC
  Filled 2015-05-19: qty 30

## 2015-05-19 MED ORDER — ONDANSETRON HCL 4 MG/2ML IJ SOLN
INTRAMUSCULAR | Status: AC
  Filled 2015-05-19: qty 2

## 2015-05-19 MED ORDER — NEOSTIGMINE METHYLSULFATE 10 MG/10ML IV SOLN
INTRAVENOUS | Status: DC | PRN
  Administered 2015-05-19: 4 mg via INTRAVENOUS

## 2015-05-19 MED ORDER — PROPOFOL 10 MG/ML IV BOLUS
INTRAVENOUS | Status: AC
  Filled 2015-05-19: qty 20

## 2015-05-19 MED ORDER — VANCOMYCIN HCL IN DEXTROSE 1-5 GM/200ML-% IV SOLN
INTRAVENOUS | Status: AC
  Administered 2015-05-19: 1000 mg via INTRAVENOUS
  Filled 2015-05-19: qty 200

## 2015-05-19 MED ORDER — GLYCOPYRROLATE 0.2 MG/ML IJ SOLN
INTRAMUSCULAR | Status: AC
  Filled 2015-05-19: qty 3

## 2015-05-19 MED ORDER — ACETAMINOPHEN 325 MG PO TABS: 650.0000 mg | ORAL_TABLET | ORAL | Status: DC | PRN

## 2015-05-19 MED ORDER — CEFAZOLIN SODIUM-DEXTROSE 2-4 GM/100ML-% IV SOLN
2.0000 g | INTRAVENOUS | Status: DC
  Filled 2015-05-19: qty 100

## 2015-05-19 MED ORDER — OXYCODONE-ACETAMINOPHEN 5-325 MG PO TABS: 1.0000 | ORAL_TABLET | Freq: Four times a day (QID) | ORAL | Status: DC | PRN

## 2015-05-19 MED ORDER — SODIUM CHLORIDE 0.9% FLUSH: 3.0000 mL | Freq: Two times a day (BID) | INTRAVENOUS | Status: DC

## 2015-05-19 MED ORDER — SODIUM CHLORIDE 0.45 % IV SOLN
INTRAVENOUS | Status: DC
  Administered 2015-05-19: 21:00:00 via INTRAVENOUS

## 2015-05-19 MED ORDER — CHLORHEXIDINE GLUCONATE 4 % EX LIQD: 60.0000 mL | Freq: Once | CUTANEOUS | Status: DC

## 2015-05-19 MED ORDER — PHENOL 1.4 % MT LIQD: 1.0000 | OROMUCOSAL | Status: DC | PRN

## 2015-05-19 MED ORDER — BUPIVACAINE HCL (PF) 0.25 % IJ SOLN
INTRAMUSCULAR | Status: DC | PRN
  Administered 2015-05-19: 10 mL

## 2015-05-19 MED ORDER — PROPOFOL 10 MG/ML IV BOLUS
INTRAVENOUS | Status: DC | PRN
  Administered 2015-05-19: 150 mg via INTRAVENOUS

## 2015-05-19 MED ORDER — FENTANYL CITRATE (PF) 100 MCG/2ML IJ SOLN
INTRAMUSCULAR | Status: AC
  Filled 2015-05-19: qty 2

## 2015-05-19 MED ORDER — FENTANYL CITRATE (PF) 100 MCG/2ML IJ SOLN
25.0000 ug | INTRAMUSCULAR | Status: DC | PRN
  Administered 2015-05-19 (×4): 25 ug via INTRAVENOUS

## 2015-05-19 MED ORDER — 0.9 % SODIUM CHLORIDE (POUR BTL) OPTIME
TOPICAL | Status: DC | PRN
  Administered 2015-05-19: 1000 mL

## 2015-05-19 MED ORDER — ONDANSETRON HCL 4 MG/2ML IJ SOLN: 4.0000 mg | INTRAMUSCULAR | Status: DC | PRN

## 2015-05-19 MED ORDER — OXYCODONE-ACETAMINOPHEN 5-325 MG PO TABS
1.0000 | ORAL_TABLET | Freq: Four times a day (QID) | ORAL | Status: DC | PRN
  Administered 2015-05-19 – 2015-05-20 (×3): 2 via ORAL
  Filled 2015-05-19 (×2): qty 2

## 2015-05-19 MED ORDER — SODIUM CHLORIDE 0.9% FLUSH: 3.0000 mL | INTRAVENOUS | Status: DC | PRN

## 2015-05-19 SURGICAL SUPPLY — 49 items
ADH SKN CLS APL DERMABOND .7 (GAUZE/BANDAGES/DRESSINGS) ×2
BUR ROUND FLUTED 4 SOFT TCH (BURR) ×1 IMPLANT
BUR ROUND FLUTED 4MM SOFT TCH (BURR) ×1
CLOSURE STERI-STRIP 1/2X4 (GAUZE/BANDAGES/DRESSINGS) ×1
CLSR STERI-STRIP ANTIMIC 1/2X4 (GAUZE/BANDAGES/DRESSINGS) ×2 IMPLANT
COVER SURGICAL LIGHT HANDLE (MISCELLANEOUS) ×3 IMPLANT
DERMABOND ADVANCED (GAUZE/BANDAGES/DRESSINGS) ×4
DERMABOND ADVANCED .7 DNX12 (GAUZE/BANDAGES/DRESSINGS) ×1 IMPLANT
DRAPE MICROSCOPE LEICA (MISCELLANEOUS) ×3 IMPLANT
DRAPE PROXIMA HALF (DRAPES) ×6 IMPLANT
DRSG MEPILEX BORDER 4X4 (GAUZE/BANDAGES/DRESSINGS) ×1 IMPLANT
DRSG MEPILEX BORDER 4X8 (GAUZE/BANDAGES/DRESSINGS) ×3 IMPLANT
DURAPREP 26ML APPLICATOR (WOUND CARE) ×3 IMPLANT
DURASEAL SPINE SEALANT 3ML (MISCELLANEOUS) ×2 IMPLANT
ELECT REM PT RETURN 9FT ADLT (ELECTROSURGICAL) ×3
ELECTRODE REM PT RTRN 9FT ADLT (ELECTROSURGICAL) ×1 IMPLANT
GLOVE BIOGEL PI IND STRL 8 (GLOVE) ×2 IMPLANT
GLOVE BIOGEL PI INDICATOR 8 (GLOVE) ×4
GLOVE ORTHO TXT STRL SZ7.5 (GLOVE) ×6 IMPLANT
GOWN STRL REUS W/ TWL LRG LVL3 (GOWN DISPOSABLE) ×2 IMPLANT
GOWN STRL REUS W/ TWL XL LVL3 (GOWN DISPOSABLE) ×1 IMPLANT
GOWN STRL REUS W/TWL 2XL LVL3 (GOWN DISPOSABLE) ×3 IMPLANT
GOWN STRL REUS W/TWL LRG LVL3 (GOWN DISPOSABLE) ×6
GOWN STRL REUS W/TWL XL LVL3 (GOWN DISPOSABLE) ×3
KIT BASIN OR (CUSTOM PROCEDURE TRAY) ×3 IMPLANT
KIT ROOM TURNOVER OR (KITS) ×3 IMPLANT
MANIFOLD NEPTUNE II (INSTRUMENTS) ×3 IMPLANT
NDL HYPO 25GX1X1/2 BEV (NEEDLE) ×1 IMPLANT
NDL SPNL 18GX3.5 QUINCKE PK (NEEDLE) ×1 IMPLANT
NEEDLE HYPO 25GX1X1/2 BEV (NEEDLE) ×3 IMPLANT
NEEDLE SPNL 18GX3.5 QUINCKE PK (NEEDLE) ×3 IMPLANT
NS IRRIG 1000ML POUR BTL (IV SOLUTION) ×3 IMPLANT
PACK LAMINECTOMY ORTHO (CUSTOM PROCEDURE TRAY) ×3 IMPLANT
PAD ARMBOARD 7.5X6 YLW CONV (MISCELLANEOUS) ×6 IMPLANT
PATTIES SURGICAL .5 X.5 (GAUZE/BANDAGES/DRESSINGS) ×2 IMPLANT
PATTIES SURGICAL .75X.75 (GAUZE/BANDAGES/DRESSINGS) IMPLANT
SPONGE LAP 4X18 X RAY DECT (DISPOSABLE) ×2 IMPLANT
SUT BONE WAX W31G (SUTURE) IMPLANT
SUT NURALON 4 0 TR CR/8 (SUTURE) ×2 IMPLANT
SUT VIC AB 0 CT1 27 (SUTURE) ×3
SUT VIC AB 0 CT1 27XBRD ANBCTR (SUTURE) ×1 IMPLANT
SUT VIC AB 1 CT1 27 (SUTURE) ×3
SUT VIC AB 1 CT1 27XBRD ANBCTR (SUTURE) IMPLANT
SUT VIC AB 2-0 CT1 27 (SUTURE) ×3
SUT VIC AB 2-0 CT1 TAPERPNT 27 (SUTURE) ×1 IMPLANT
SUT VIC AB 3-0 X1 27 (SUTURE) IMPLANT
TOWEL OR 17X24 6PK STRL BLUE (TOWEL DISPOSABLE) ×3 IMPLANT
TOWEL OR 17X26 10 PK STRL BLUE (TOWEL DISPOSABLE) ×3 IMPLANT
WATER STERILE IRR 1000ML POUR (IV SOLUTION) ×3 IMPLANT

## 2015-05-19 NOTE — Transfer of Care (Signed)
Immediate Anesthesia Transfer of Care Note  Patient: Seth Foley  Procedure(s) Performed: Procedure(s): L3-4, L4-5 Decompression (N/A)  Patient Location: PACU  Anesthesia Type:General  Level of Consciousness: awake, alert  and oriented  Airway & Oxygen Therapy: Patient Spontanous Breathing and Patient connected to nasal cannula oxygen  Post-op Assessment: Report given to RN, Post -op Vital signs reviewed and stable and Patient moving all extremities X 4  Post vital signs: Reviewed and stable  Last Vitals:  Filed Vitals:   05/19/15 0754  BP: 149/84  Pulse: 61  Temp: 36.8 C  Resp: 18    Complications: No apparent anesthesia complications

## 2015-05-19 NOTE — Anesthesia Preprocedure Evaluation (Signed)
Anesthesia Evaluation  Patient identified by MRN, date of birth, ID band Patient awake    Reviewed: Allergy & Precautions, H&P , NPO status , Patient's Chart, lab work & pertinent test results  Airway Mallampati: I  TM Distance: >3 FB Neck ROM: full    Dental  (+) Edentulous Lower   Pulmonary neg pulmonary ROS, former smoker,    Pulmonary exam normal        Cardiovascular hypertension, Pt. on medications  Rhythm:regular     Neuro/Psych negative neurological ROS  negative psych ROS   GI/Hepatic negative GI ROS, Neg liver ROS,   Endo/Other  diabetes, Type 2  Renal/GU negative Renal ROS     Musculoskeletal   Abdominal Normal abdominal exam  (+)   Peds  Hematology negative hematology ROS (+)   Anesthesia Other Findings   Reproductive/Obstetrics negative OB ROS                             Anesthesia Physical Anesthesia Plan  ASA: II  Anesthesia Plan: General   Post-op Pain Management:    Induction: Intravenous  Airway Management Planned: Oral ETT  Additional Equipment:   Intra-op Plan:   Post-operative Plan: Extubation in OR  Informed Consent: I have reviewed the patients History and Physical, chart, labs and discussed the procedure including the risks, benefits and alternatives for the proposed anesthesia with the patient or authorized representative who has indicated his/her understanding and acceptance.   Dental Advisory Given  Plan Discussed with: CRNA and Surgeon  Anesthesia Plan Comments:         Anesthesia Quick Evaluation

## 2015-05-19 NOTE — Anesthesia Procedure Notes (Signed)
Procedure Name: Intubation Date/Time: 05/19/2015 11:06 AM Performed by: Gaylene Brooks Pre-anesthesia Checklist: Patient identified, Emergency Drugs available, Suction available and Patient being monitored Patient Re-evaluated:Patient Re-evaluated prior to inductionOxygen Delivery Method: Circle System Utilized Preoxygenation: Pre-oxygenation with 100% oxygen Intubation Type: IV induction Ventilation: Mask ventilation without difficulty Laryngoscope Size: Miller and 2 Grade View: Grade I Tube type: Oral Tube size: 7.5 mm Number of attempts: 1 Airway Equipment and Method: Stylet and Oral airway Placement Confirmation: ETT inserted through vocal cords under direct vision,  positive ETCO2 and breath sounds checked- equal and bilateral Secured at: 21 cm Tube secured with: Tape Dental Injury: Teeth and Oropharynx as per pre-operative assessment

## 2015-05-19 NOTE — Interval H&P Note (Signed)
History and Physical Interval Note:  05/19/2015 10:01 AM  Seth Foley  has presented today for surgery, with the diagnosis of L3-4, L4-5 Decompression  The various methods of treatment have been discussed with the patient and family. After consideration of risks, benefits and other options for treatment, the patient has consented to  Procedure(s): L3-4, L4-5 Decompression (N/A) as a surgical intervention .  The patient's history has been reviewed, patient examined, no change in status, stable for surgery.  I have reviewed the patient's chart and labs.  Questions were answered to the patient's satisfaction.     Riordan Walle C

## 2015-05-19 NOTE — Brief Op Note (Signed)
05/19/2015  1:50 PM  PATIENT:  Seth Foley  80 y.o. male  PRE-OPERATIVE DIAGNOSIS:  L3-4, L4-5 Decompression  POST-OPERATIVE DIAGNOSIS:  L3-4, L4-5 Decompression  PROCEDURE:  Procedure(s): L3-4, L4-5 Decompression (N/A)  SURGEON:  Surgeon(s) and Role:    * Marybelle Killings, MD - Primary  PHYSICIAN ASSISTANT: Benjiman Core pa-c    ANESTHESIA:   general  EBL:  Total I/O In: 1700 [I.V.:1700] Out: 100 [Blood:100]  BLOOD ADMINISTERED:none  DRAINS: none   LOCAL MEDICATIONS USED:  MARCAINE     SPECIMEN:  No Specimen  DISPOSITION OF SPECIMEN:  N/A  COUNTS:  YES  TOURNIQUET:  * No tourniquets in log *  DICTATION: .Dragon Dictation  PLAN OF CARE: Admit for overnight observation  PATIENT DISPOSITION:  PACU - hemodynamically stable.

## 2015-05-19 NOTE — Anesthesia Postprocedure Evaluation (Deleted)
Anesthesia Post Note  Patient: Seth Foley  Procedure(s) Performed: Procedure(s) (LRB): L3-4, L4-5 Decompression (N/A)  Patient location during evaluation: PACU Anesthesia Type: General Level of consciousness: sedated Vital Signs Assessment: post-procedure vital signs reviewed and stable Respiratory status: spontaneous breathing Cardiovascular status: stable Postop Assessment: no signs of nausea or vomiting Anesthetic complications: no    Last Vitals:  Filed Vitals:   05/19/15 1345 05/19/15 1400  BP: 125/70 87/62  Pulse: 60 59  Temp:    Resp: 20 16    Last Pain:  Filed Vitals:   05/19/15 1412  PainSc: 10-Worst pain ever                 Tearia Gibbs JR,JOHN Verdean Murin

## 2015-05-19 NOTE — H&P (Signed)
Seth Foley is an 80 y.o. male.   Chief Complaint:  Low back pain and leg pain HPI:  Patient with hx of L3-4 and L4-5 stenosis presents with above complaint.  Progressively worsening symptoms.  Failed conservative treatment.    Past Medical History  Diagnosis Date  . Hypertension   . Prostate infection   . History of kidney stones     pass  . Diabetes mellitus without complication (HCC)     Type II  . BPH (benign prostatic hyperplasia)   . Cancer ([place])     skin cancer - side of head-     Past Surgical History  Procedure Laterality Date  . Hydrocele excision    . Nasal reconstruction    . Appendectomy    . Back surgery      disc  . Colonoscopy w/ polypectomy    . Knee arthroscopy Bilateral     No family history on file. Social History:  reports that he has quit smoking. His smoking use included Cigarettes. He does not have any smokeless tobacco history on file. He reports that he does not drink alcohol or use illicit drugs.  Allergies:  Allergies  Allergen Reactions  . Morphine And Related Rash    Medications Prior to Admission  Medication Sig Dispense Refill  . ibuprofen (ADVIL,MOTRIN) 200 MG tablet Take 600 mg by mouth daily as needed (pain).    Marland Kitchen losartan (COZAAR) 100 MG tablet Take 100 mg by mouth daily.   1  . metFORMIN (GLUCOPHAGE) 500 MG tablet Take 500 mg by mouth daily with breakfast.   8  . Polyethyl Glycol-Propyl Glycol (SYSTANE OP) Place 1 drop into both eyes daily as needed (dry eyes).    . simvastatin (ZOCOR) 20 MG tablet Take 20 mg by mouth at bedtime.   1  . HYDROcodone-acetaminophen (NORCO/VICODIN) 5-325 MG per tablet Take 1-2 tablets by mouth every 4 (four) hours as needed for moderate pain. (Patient not taking: Reported on 05/12/2015) 30 tablet 0    No results found for this or any previous visit (from the past 48 hour(s)). No results found.  Review of Systems  Constitutional: Negative.   HENT: Negative.   Respiratory: Negative.    Cardiovascular: Negative.   Genitourinary: Negative.   Musculoskeletal: Positive for back pain.  Endo/Heme/Allergies: Negative.   Psychiatric/Behavioral: Negative.     There were no vitals taken for this visit. Physical Exam  Constitutional: He is oriented to person, place, and time. No distress.  HENT:  Head: Normocephalic and atraumatic.  Eyes: EOM are normal.  Neck: Normal range of motion.  Cardiovascular: Normal rate.   Respiratory: Effort normal. No respiratory distress.  GI: He exhibits no distension.  Musculoskeletal: He exhibits tenderness.  Neurological: He is alert and oriented to person, place, and time.  Skin: Skin is warm and dry.  Psychiatric: He has a normal mood and affect.     EXAM: MRI LUMBAR SPINE WITHOUT CONTRAST  TECHNIQUE: Multiplanar, multisequence MR imaging of the lumbar spine was performed. No intravenous contrast was administered.  COMPARISON: None.  FINDINGS: Normal lumbar alignment. Negative for fracture or mass lesion. Conus medullaris is normal and terminates at L1  L1-2: Disc bulging and spondylosis. Mild facet degeneration. Mild spinal stenosis.  L2-3: Diffuse disc bulging and endplate spurring. Bilateral facet hypertrophy with moderate spinal stenosis. Subarticular stenosis on the left with possible impingement left L3 nerve root. Mild left foraminal narrowing.  L3-4: Disc degeneration and spondylosis with endplate spurring left greater  than right. Bilateral facet hypertrophy. Moderate spinal stenosis. Subarticular stenosis on left with possible impingement of the left L4 nerve root. Mild left foraminal narrowing  L4-5: Disc degeneration and spondylosis. Bilateral facet hypertrophy. Moderate spinal stenosis and moderate foraminal stenosis bilaterally.  L5-S1: Left-sided laminectomy. Disc degeneration and spondylosis without significant spinal stenosis. Mild left foraminal narrowing.  IMPRESSION: Multilevel  spondylosis and facet degeneration.  Moderate spinal stenosis L2-3 with left-sided stenosis  Moderate spinal stenosis L3-4 with left-sided stenosis  Moderate spinal stenosis and moderate foraminal encroachment bilaterally at L4-5. Assessment/Plan L3-4, L4-5 stenosis. Will proceed with L3-4 and L4-5 decompression as scheduled.  Procedure along with possible risks and complications discussed.  All questions answered and wishes to proceed.   Lanae Crumbly, PA-C 05/19/2015, 7:45 AM

## 2015-05-20 DIAGNOSIS — Z87442 Personal history of urinary calculi: Secondary | ICD-10-CM | POA: Diagnosis not present

## 2015-05-20 DIAGNOSIS — M4806 Spinal stenosis, lumbar region: Secondary | ICD-10-CM | POA: Diagnosis not present

## 2015-05-20 DIAGNOSIS — M5136 Other intervertebral disc degeneration, lumbar region: Secondary | ICD-10-CM | POA: Diagnosis not present

## 2015-05-20 DIAGNOSIS — E119 Type 2 diabetes mellitus without complications: Secondary | ICD-10-CM | POA: Diagnosis not present

## 2015-05-20 DIAGNOSIS — I10 Essential (primary) hypertension: Secondary | ICD-10-CM | POA: Diagnosis not present

## 2015-05-20 DIAGNOSIS — N400 Enlarged prostate without lower urinary tract symptoms: Secondary | ICD-10-CM | POA: Diagnosis not present

## 2015-05-20 LAB — HEMOGLOBIN A1C
HEMOGLOBIN A1C: 6.7 % — AB (ref 4.8–5.6)
MEAN PLASMA GLUCOSE: 146 mg/dL

## 2015-05-20 LAB — GLUCOSE, CAPILLARY: Glucose-Capillary: 133 mg/dL — ABNORMAL HIGH (ref 65–99)

## 2015-05-20 MED ORDER — TAMSULOSIN HCL 0.4 MG PO CAPS
0.4000 mg | ORAL_CAPSULE | Freq: Every day | ORAL | Status: DC
  Administered 2015-05-20: 0.4 mg via ORAL
  Filled 2015-05-20: qty 1

## 2015-05-20 NOTE — Care Management Obs Status (Signed)
[facility] NOTIFICATION   Patient Details  Name: Seth Foley MRN: [id number] Date of Birth: 1935-11-22   Medicare Observation Status Notification Given:  Yes   Ninfa Meeker, RN 05/20/2015, 10:42 AM

## 2015-05-20 NOTE — Discharge Summary (Signed)
Physician Discharge Summary  Patient ID: Seth Foley MRN: [id number] DOB/AGE: 1935/07/11 80 y.o.  Admit date: 05/19/2015 Discharge date: 05/20/2015  Admission Diagnoses:Degenerative disc disease lumbar spine  Discharge Diagnoses:  Active Problems:   History of lumbar laminectomy for spinal cord decompression   Discharged Condition: stable  Hospital Course: Patient's hospital course was essentially unremarkable. He underwent lumbar decompression surgery postoperatively he progressed well and was discharged to home in stable condition.  Consults: None  Significant Diagnostic Studies: labs: Routine labs  Treatments: surgery: See operative note  Discharge Exam: Blood pressure 129/76, pulse 64, temperature 98.3 F (36.8 C), temperature source Oral, resp. rate 16, height 5\' 9"  (1.753 m), weight 92.579 kg (204 lb 1.6 oz), SpO2 98 %. Incision/Wound: Dressing clean and dry  Disposition: 01-Home or Self Care  Discharge Instructions    Call MD / Call 911    Complete by:  As directed   If you experience chest pain or shortness of breath, CALL 911 and be transported to the hospital emergency room.  If you develope a fever above 101 F, pus (white drainage) or increased drainage or redness at the wound, or calf pain, call your surgeon's office.     Call MD / Call 911    Complete by:  As directed   If you experience chest pain or shortness of breath, CALL 911 and be transported to the hospital emergency room.  If you develope a fever above 101 F, pus (white drainage) or increased drainage or redness at the wound, or calf pain, call your surgeon's office.     Constipation Prevention    Complete by:  As directed   Drink plenty of fluids.  Prune juice may be helpful.  You may use a stool softener, such as Colace (over the counter) 100 mg twice a day.  Use MiraLax (over the counter) for constipation as needed.     Constipation Prevention    Complete by:  As directed   Drink plenty of fluids.   Prune juice may be helpful.  You may use a stool softener, such as Colace (over the counter) 100 mg twice a day.  Use MiraLax (over the counter) for constipation as needed.     Diet - low sodium heart healthy    Complete by:  As directed      Diet - low sodium heart healthy    Complete by:  As directed      Discharge instructions    Complete by:  As directed   Ok to shower 5 days postop.  Do not apply any creams or ointments to incision.  Do not remove steri-strips.  Can use 4x4 gauze and tape for dressing changes.  No aggressive activity.  No bending, squatting or prolonged sitting.  Mostly be in reclined position or lying down.     Driving restrictions    Complete by:  As directed   No driving     Increase activity slowly as tolerated    Complete by:  As directed      Increase activity slowly as tolerated    Complete by:  As directed      Lifting restrictions    Complete by:  As directed   No lifting            Medication List    STOP taking these medications        HYDROcodone-acetaminophen 5-325 MG tablet  Commonly known as:  NORCO/VICODIN     ibuprofen 200 MG tablet  Commonly known as:  ADVIL,MOTRIN      TAKE these medications        losartan 100 MG tablet  Commonly known as:  COZAAR  Take 100 mg by mouth daily.     metFORMIN 500 MG tablet  Commonly known as:  GLUCOPHAGE  Take 500 mg by mouth daily with breakfast.     methocarbamol 500 MG tablet  Commonly known as:  ROBAXIN  Take 1 tablet (500 mg total) by mouth every 6 (six) hours as needed for muscle spasms.     oxyCODONE-acetaminophen 5-325 MG tablet  Commonly known as:  PERCOCET/ROXICET  Take 1 tablet by mouth every 6 (six) hours as needed for moderate pain.     simvastatin 20 MG tablet  Commonly known as:  ZOCOR  Take 20 mg by mouth at bedtime.     SYSTANE OP  Place 1 drop into both eyes daily as needed (dry eyes).           Follow-up Information    Schedule an appointment as soon as possible  for a visit with Marybelle Killings, MD.   Specialty:  Orthopedic Surgery   Why:  need return office visit one week postop   Contact information:   [place] [address] [phone]       Signed: Newt Minion 05/20/2015, 8:52 AM

## 2015-05-20 NOTE — Op Note (Signed)
NAME:  Seth Foley, Seth Foley                 ACCOUNT NO.:  [id number]  MEDICAL RECORD NO.:  [id number]  LOCATION:  [id number]                        FACILITY:  [facility]  PHYSICIAN:  Chrissa Meetze C. Lorin Mercy, M.D.    DATE OF BIRTH:  1935/07/29  DATE OF PROCEDURE:  05/19/2015 DATE OF DISCHARGE:                              OPERATIVE REPORT   PREOPERATIVE DIAGNOSIS:  L3-4 and L4-5 stenosis, previous L5-S1 surgery.  POSTOPERATIVE DIAGNOSIS:  L3-4 and L4-5 stenosis, previous L5-S1 surgery.  PROCEDURE:  L3-4 and L4-5 central decompression.  SURGEON:  Esha Fincher C. Lorin Mercy, M.D.  ASSISTANT:  Alyson Locket. Ricard Dillon, PA-C, medically necessary and present for the entire procedure.  ANESTHESIA:  General.  EBL:  Less than 200 mL.  INDICATIOINS:  This 80 year old male, who has had problems with neurogenic claudication symptoms.  MRI scan showed moderate stenosis at L3-4 and L4-5.  The patient had previous surgery at L5-S1 by Dr. Lyman Foley 40+ years ago.  The patient had been treated conservatively, not made improvement.  He was having claudication symptoms with pain standing and pain with ambulation.  PROCEDURE IN DETAIL:  After induction of general anesthesia, orotracheal intubation, the patient was placed on chest rolls, careful padding, and positioning.  Padding over the ulnar nerve.  Back was prepped with DuraPrep.  The area squared with towels, Betadine and Steri-Drape applied, laminectomy sheet, time-out procedure.  The patient had positive PCR, positive SSA and MRSA and vancomycin was given and completely infused before the incision after time-out procedure.  Cross- table lateral x-ray showed the needle position for the planned decompression just above and below the 2 operative levels.  Spinal needles were adjusted, incision was made and then once subperiosteal dissection onto the lamina, self-retaining tractors were placed.  Kocher clamps were placed and a 2nd x-ray was taken.  Central decompression was performed, and there  was a very large thick chunks of ligament and overhanging spurs present contributing to the moderate stenosis.  These were bulging.  There were no fragments present.  Decompression was performed until there was epidural fat proximally and distally below the 2 levels.  Lateral gutters were run and made sure all remaining spikes were removed.  A small less than 1 mm bleb was present centrally at the top of L5 where the patient had previous surgery.This was from surgery by Dr. Lyman Foley 40 plus years ago. There was no CSF leak.   A single Nurolon 4-0 was placed.  There was no spinal fluid leakage noted, where the bleb was present, single Nurolon was placed just to reinforce it in [place] the patient had any further thinning.  A few drops of blue DuraSeal was dripped over the top.  Hockey stick and D'Errico were used to run the gutters.  There was no epidural bleeding.  Bone wax was used on the lateral gutters, where there was some bone oozing blood.  Careful irrigation.  Dura was intact.  There was no CSF leak.  The dural tube was round.  No areas compression.  This was checked on both sides, both at L3-4 and L4-5.  Central laminectomy was done at L3, L4, and the top one-half of L5 where the patient had  previous surgery.  The patient tolerated the procedure well.  #1 Vicryl was used to close the deep layer, 2-0 Vicryl subcuticular closure, Dermabond skin, postop dressing and transferred to recovery room.  Instrument count and needle count were correct.  The patient tolerated the procedure well.     Chanele Douglas C. Lorin Mercy, M.D.     MCY/MEDQ  D:  05/19/2015  T:  05/20/2015  Job:  [id number]

## 2015-05-20 NOTE — Clinical Social Work Note (Signed)
Clinical Social Worker received standing order referral for possible ST-SNF placement.  Chart reviewed.  PT/OT recommending home with home health.  Spoke with RN [place] Manager who will follow up with patient to discuss home health needs.    CSW signing off - please re consult if social work needs arise.  Jesse Sameen Leas, LCSW [phone] 

## 2015-05-22 ENCOUNTER — Encounter (HOSPITAL_COMMUNITY): Payer: Self-pay | Admitting: Orthopaedic Surgery

## 2015-05-22 NOTE — Anesthesia Postprocedure Evaluation (Signed)
Anesthesia Post Note  Patient: Seth Foley  Procedure(s) Performed: Procedure(s) (LRB): L3-4, L4-5 Decompression (N/A)  Patient location during evaluation: PACU Anesthesia Type: General Level of consciousness: awake Pain management: pain level controlled Vital Signs Assessment: post-procedure vital signs reviewed and stable Respiratory status: spontaneous breathing Cardiovascular status: stable Postop Assessment: no signs of nausea or vomiting Anesthetic complications: no    Last Vitals:  Filed Vitals:   05/20/15 0024 05/20/15 0422  BP: 111/56 129/76  Pulse: 58 64  Temp: 36.9 C 36.8 C  Resp: 16 16    Last Pain:  Filed Vitals:   05/20/15 1441  PainSc: 0-No pain                 Akayla Brass JR,JOHN [place]

## 2015-07-11 DIAGNOSIS — M4806 Spinal stenosis, lumbar region: Secondary | ICD-10-CM | POA: Diagnosis not present

## 2015-07-11 DIAGNOSIS — M545 Low back pain: Secondary | ICD-10-CM | POA: Diagnosis not present

## 2015-07-11 DIAGNOSIS — M25512 Pain in left shoulder: Secondary | ICD-10-CM | POA: Diagnosis not present

## 2015-07-11 DIAGNOSIS — M4726 Other spondylosis with radiculopathy, lumbar region: Secondary | ICD-10-CM | POA: Diagnosis not present

## 2015-07-11 DIAGNOSIS — M25511 Pain in right shoulder: Secondary | ICD-10-CM | POA: Diagnosis not present

## 2015-07-11 DIAGNOSIS — M542 Cervicalgia: Secondary | ICD-10-CM | POA: Diagnosis not present

## 2015-08-29 DIAGNOSIS — E119 Type 2 diabetes mellitus without complications: Secondary | ICD-10-CM | POA: Diagnosis not present

## 2015-08-29 DIAGNOSIS — H2513 Age-related nuclear cataract, bilateral: Secondary | ICD-10-CM | POA: Diagnosis not present

## 2015-08-29 DIAGNOSIS — Z7984 Long term (current) use of oral hypoglycemic drugs: Secondary | ICD-10-CM | POA: Diagnosis not present

## 2015-10-24 DIAGNOSIS — E782 Mixed hyperlipidemia: Secondary | ICD-10-CM | POA: Diagnosis not present

## 2015-10-24 DIAGNOSIS — E119 Type 2 diabetes mellitus without complications: Secondary | ICD-10-CM | POA: Diagnosis not present

## 2015-10-24 DIAGNOSIS — Z7984 Long term (current) use of oral hypoglycemic drugs: Secondary | ICD-10-CM | POA: Diagnosis not present

## 2015-11-22 DIAGNOSIS — Z23 Encounter for immunization: Secondary | ICD-10-CM | POA: Diagnosis not present

## 2016-04-26 DIAGNOSIS — R208 Other disturbances of skin sensation: Secondary | ICD-10-CM | POA: Diagnosis not present

## 2016-04-26 DIAGNOSIS — G629 Polyneuropathy, unspecified: Secondary | ICD-10-CM | POA: Diagnosis not present

## 2016-05-07 DIAGNOSIS — L089 Local infection of the skin and subcutaneous tissue, unspecified: Secondary | ICD-10-CM | POA: Diagnosis not present

## 2016-05-16 DIAGNOSIS — E1140 Type 2 diabetes mellitus with diabetic neuropathy, unspecified: Secondary | ICD-10-CM | POA: Diagnosis not present

## 2016-05-16 DIAGNOSIS — Z0000 Encounter for general adult medical examination without abnormal findings: Secondary | ICD-10-CM | POA: Diagnosis not present

## 2016-05-16 DIAGNOSIS — E119 Type 2 diabetes mellitus without complications: Secondary | ICD-10-CM | POA: Diagnosis not present

## 2016-05-16 DIAGNOSIS — I10 Essential (primary) hypertension: Secondary | ICD-10-CM | POA: Diagnosis not present

## 2016-05-16 DIAGNOSIS — M48061 Spinal stenosis, lumbar region without neurogenic claudication: Secondary | ICD-10-CM | POA: Diagnosis not present

## 2016-05-16 DIAGNOSIS — E782 Mixed hyperlipidemia: Secondary | ICD-10-CM | POA: Diagnosis not present

## 2016-05-16 DIAGNOSIS — Z23 Encounter for immunization: Secondary | ICD-10-CM | POA: Diagnosis not present

## 2016-05-16 DIAGNOSIS — R972 Elevated prostate specific antigen [PSA]: Secondary | ICD-10-CM | POA: Diagnosis not present

## 2016-05-16 DIAGNOSIS — Z7984 Long term (current) use of oral hypoglycemic drugs: Secondary | ICD-10-CM | POA: Diagnosis not present

## 2016-05-16 DIAGNOSIS — Z79899 Other long term (current) drug therapy: Secondary | ICD-10-CM | POA: Diagnosis not present

## 2016-05-16 DIAGNOSIS — L570 Actinic keratosis: Secondary | ICD-10-CM | POA: Diagnosis not present

## 2016-05-16 DIAGNOSIS — H9190 Unspecified hearing loss, unspecified ear: Secondary | ICD-10-CM | POA: Diagnosis not present

## 2016-06-07 DIAGNOSIS — D225 Melanocytic nevi of trunk: Secondary | ICD-10-CM | POA: Diagnosis not present

## 2016-06-07 DIAGNOSIS — X32XXXD Exposure to sunlight, subsequent encounter: Secondary | ICD-10-CM | POA: Diagnosis not present

## 2016-06-07 DIAGNOSIS — L570 Actinic keratosis: Secondary | ICD-10-CM | POA: Diagnosis not present

## 2016-08-28 DIAGNOSIS — Z7984 Long term (current) use of oral hypoglycemic drugs: Secondary | ICD-10-CM | POA: Diagnosis not present

## 2016-08-28 DIAGNOSIS — H2513 Age-related nuclear cataract, bilateral: Secondary | ICD-10-CM | POA: Diagnosis not present

## 2016-08-28 DIAGNOSIS — E119 Type 2 diabetes mellitus without complications: Secondary | ICD-10-CM | POA: Diagnosis not present

## 2016-11-01 DIAGNOSIS — H25013 Cortical age-related cataract, bilateral: Secondary | ICD-10-CM | POA: Diagnosis not present

## 2016-11-01 DIAGNOSIS — H25041 Posterior subcapsular polar age-related cataract, right eye: Secondary | ICD-10-CM | POA: Diagnosis not present

## 2016-11-01 DIAGNOSIS — H25043 Posterior subcapsular polar age-related cataract, bilateral: Secondary | ICD-10-CM | POA: Diagnosis not present

## 2016-11-01 DIAGNOSIS — H2513 Age-related nuclear cataract, bilateral: Secondary | ICD-10-CM | POA: Diagnosis not present

## 2016-11-01 DIAGNOSIS — H25011 Cortical age-related cataract, right eye: Secondary | ICD-10-CM | POA: Diagnosis not present

## 2016-11-01 DIAGNOSIS — H2511 Age-related nuclear cataract, right eye: Secondary | ICD-10-CM | POA: Diagnosis not present

## 2016-11-11 DIAGNOSIS — H2511 Age-related nuclear cataract, right eye: Secondary | ICD-10-CM | POA: Diagnosis not present

## 2016-11-11 DIAGNOSIS — H2513 Age-related nuclear cataract, bilateral: Secondary | ICD-10-CM | POA: Diagnosis not present

## 2016-11-12 DIAGNOSIS — H2512 Age-related nuclear cataract, left eye: Secondary | ICD-10-CM | POA: Diagnosis not present

## 2016-11-19 DIAGNOSIS — I10 Essential (primary) hypertension: Secondary | ICD-10-CM | POA: Diagnosis not present

## 2016-11-19 DIAGNOSIS — R972 Elevated prostate specific antigen [PSA]: Secondary | ICD-10-CM | POA: Diagnosis not present

## 2016-11-19 DIAGNOSIS — Z0000 Encounter for general adult medical examination without abnormal findings: Secondary | ICD-10-CM | POA: Diagnosis not present

## 2016-11-19 DIAGNOSIS — H9190 Unspecified hearing loss, unspecified ear: Secondary | ICD-10-CM | POA: Diagnosis not present

## 2016-11-19 DIAGNOSIS — M48061 Spinal stenosis, lumbar region without neurogenic claudication: Secondary | ICD-10-CM | POA: Diagnosis not present

## 2016-11-19 DIAGNOSIS — Z23 Encounter for immunization: Secondary | ICD-10-CM | POA: Diagnosis not present

## 2016-11-19 DIAGNOSIS — E782 Mixed hyperlipidemia: Secondary | ICD-10-CM | POA: Diagnosis not present

## 2016-11-19 DIAGNOSIS — E1165 Type 2 diabetes mellitus with hyperglycemia: Secondary | ICD-10-CM | POA: Diagnosis not present

## 2016-11-19 DIAGNOSIS — E1140 Type 2 diabetes mellitus with diabetic neuropathy, unspecified: Secondary | ICD-10-CM | POA: Diagnosis not present

## 2016-11-19 DIAGNOSIS — L570 Actinic keratosis: Secondary | ICD-10-CM | POA: Diagnosis not present

## 2016-11-19 DIAGNOSIS — Z7984 Long term (current) use of oral hypoglycemic drugs: Secondary | ICD-10-CM | POA: Diagnosis not present

## 2016-11-25 DIAGNOSIS — H2512 Age-related nuclear cataract, left eye: Secondary | ICD-10-CM | POA: Diagnosis not present

## 2016-11-25 DIAGNOSIS — H2513 Age-related nuclear cataract, bilateral: Secondary | ICD-10-CM | POA: Diagnosis not present

## 2017-01-28 ENCOUNTER — Other Ambulatory Visit: Payer: Self-pay

## 2017-01-28 ENCOUNTER — Encounter (HOSPITAL_BASED_OUTPATIENT_CLINIC_OR_DEPARTMENT_OTHER): Payer: Self-pay

## 2017-01-28 ENCOUNTER — Emergency Department (HOSPITAL_BASED_OUTPATIENT_CLINIC_OR_DEPARTMENT_OTHER)
Admission: EM | Admit: 2017-01-28 | Discharge: 2017-01-28 | Disposition: A | Discharge status: Home / Self Care | Payer: Medicare Other | Attending: Emergency Medicine | Admitting: Emergency Medicine

## 2017-01-28 ENCOUNTER — Emergency Department (HOSPITAL_BASED_OUTPATIENT_CLINIC_OR_DEPARTMENT_OTHER): Payer: Medicare Other

## 2017-01-28 DIAGNOSIS — R2231 Localized swelling, mass and lump, right upper limb: Secondary | ICD-10-CM | POA: Diagnosis present

## 2017-01-28 DIAGNOSIS — M79644 Pain in right finger(s): Secondary | ICD-10-CM | POA: Diagnosis not present

## 2017-01-28 DIAGNOSIS — Z7984 Long term (current) use of oral hypoglycemic drugs: Secondary | ICD-10-CM | POA: Insufficient documentation

## 2017-01-28 DIAGNOSIS — L03113 Cellulitis of right upper limb: Secondary | ICD-10-CM | POA: Diagnosis not present

## 2017-01-28 DIAGNOSIS — L03011 Cellulitis of right finger: Secondary | ICD-10-CM | POA: Insufficient documentation

## 2017-01-28 DIAGNOSIS — Z87891 Personal history of nicotine dependence: Secondary | ICD-10-CM | POA: Diagnosis not present

## 2017-01-28 DIAGNOSIS — E119 Type 2 diabetes mellitus without complications: Secondary | ICD-10-CM | POA: Insufficient documentation

## 2017-01-28 DIAGNOSIS — I10 Essential (primary) hypertension: Secondary | ICD-10-CM | POA: Diagnosis not present

## 2017-01-28 DIAGNOSIS — Z79899 Other long term (current) drug therapy: Secondary | ICD-10-CM | POA: Diagnosis not present

## 2017-01-28 DIAGNOSIS — M7989 Other specified soft tissue disorders: Secondary | ICD-10-CM | POA: Diagnosis not present

## 2017-01-28 MED ORDER — CLINDAMYCIN HCL 300 MG PO CAPS: 300.0000 mg | ORAL_CAPSULE | Freq: Four times a day (QID) | ORAL | 0 refills | Status: DC

## 2017-01-28 MED ORDER — LIDOCAINE HCL (PF) 1 % IJ SOLN
5.0000 mL | Freq: Once | INTRAMUSCULAR | Status: AC
  Administered 2017-01-28: 5 mL

## 2017-01-28 NOTE — ED Notes (Signed)
ED Provider at bedside. 

## 2017-01-28 NOTE — ED Provider Notes (Signed)
[facility] EMERGENCY DEPARTMENT Provider Note   CSN: [id number] Arrival date & time: 01/28/17  1307     History   Chief Complaint Chief Complaint  Patient presents with  . Hand Pain    HPI Seth Foley is a 81 y.o. male.  Patient states he was working in his shop on 01/24/17 and noticed an injury to his right index finger. Unknown as to insect bite or other trauma. Swelling and erythema to area has steadily increased, now with pain on movement and erythema extending to mid forearm. No fevers or chills. Patient has been soaking the finger at home. No purulent drainage.   The history is provided by the patient. No language interpreter was used.  Hand Pain  This is a new problem. The current episode started more than 2 days ago. The problem has been gradually worsening.    Past Medical History:  Diagnosis Date  . BPH (benign prostatic hyperplasia)   . Chronic lower back pain   . High cholesterol   . Hypertension   . Kidney stones   . Prostate infection   . Skin cancer    "temples and scalp" (05/19/2015)  . Type II diabetes mellitus [facility])     Patient Active Problem List   Diagnosis Date Noted  . History of lumbar laminectomy for spinal cord decompression 05/19/2015    Past Surgical History:  Procedure Laterality Date  . APPENDECTOMY    . BACK SURGERY    . COLONOSCOPY W/ POLYPECTOMY    . DECOMPRESSIVE LUMBAR LAMINECTOMY LEVEL 2  05/19/2015   L3-4, 4-5  . FRACTURE SURGERY    . HYDROCELE EXCISION    . KNEE ARTHROSCOPY Bilateral   . LITHOTRIPSY  X 2  . LUMBAR DISC SURGERY     "herniated disc"  . LUMBAR LAMINECTOMY/DECOMPRESSION MICRODISCECTOMY N/A 05/19/2015   Procedure: L3-4, L4-5 Decompression;  Surgeon: Marybelle Killings, MD;  Location: [facility];  Service: Orthopedics;  Laterality: N/A;  . NASAL RECONSTRUCTION         Home Medications    Prior to Admission medications   Medication Sig Start Date End Date Taking? Authorizing Provider  losartan (COZAAR)  100 MG tablet Take 100 mg by mouth daily.  09/23/14   [provider]  metFORMIN (GLUCOPHAGE) 500 MG tablet Take 500 mg by mouth daily with breakfast.  10/27/14   [provider]  Polyethyl Glycol-Propyl Glycol (SYSTANE OP) Place 1 drop into both eyes daily as needed (dry eyes).    [provider]  simvastatin (ZOCOR) 20 MG tablet Take 20 mg by mouth at bedtime.  09/27/14   [provider]    Family History No family history on file.  Social History Social History   Tobacco Use  . Smoking status: Former Smoker    Packs/day: 1.00    Years: 18.00    Pack years: 18.00    Types: Cigarettes  . Smokeless tobacco: Former Systems developer  . Tobacco comment: quit ismoking n 1971; quit chewing in 1972  Substance Use Topics  . Alcohol use: No  . Drug use: No     Allergies   Morphine and related   Review of Systems Review of Systems  Skin: Positive for wound.  All other systems reviewed and are negative.    Physical Exam Updated Vital Signs BP (!) 163/88 (BP Location: Left Arm)   Pulse 67   Temp (!) 97.5 F (36.4 C) (Oral)   Resp 20   SpO2 95%  Physical Exam  Constitutional: He is oriented to person, place, and time. He appears well-developed and well-nourished.  HENT:  Head: Atraumatic.  Eyes: Conjunctivae are normal.  Neck: Neck supple.  Cardiovascular: Normal rate and regular rhythm.  Pulmonary/Chest: Effort normal and breath sounds normal.  Abdominal: Soft. Bowel sounds are normal.  Musculoskeletal: He exhibits edema and tenderness.       Hands: Lymphadenopathy:    He has no cervical adenopathy.  Neurological: He is alert and oriented to person, place, and time.  Skin: Skin is warm and dry.  Psychiatric: He has a normal mood and affect.  Nursing note and vitals reviewed.    ED Treatments / Results  Labs (all labs ordered are listed, but only abnormal results are displayed) Labs Reviewed - No data to display  EKG  EKG  Interpretation None       Radiology Dg Finger Index Right  Result Date: 01/28/2017 CLINICAL DATA:  Right second digit pain for several days, no known injury, initial encounter EXAM: RIGHT INDEX FINGER 2+V COMPARISON:  None. FINDINGS: Soft tissue swelling is noted in the second digit. Some degenerative changes in the interphalangeal joints are seen. No acute fracture or dislocation is noted. No acute bony erosions are noted. Mild periarticular erosions are seen chronic in nature. IMPRESSION: Mild degenerative change and soft tissue swelling. No acute bony abnormality is noted. Electronically Signed   By: Inez Catalina M.D.   On: 01/28/2017 16:52    Procedures .Marland KitchenIncision and Drainage Date/Time: 01/28/2017 6:47 PM Performed by: Etta Quill, NP Authorized by: Etta Quill, NP   Consent:    Consent obtained:  Verbal   Consent given by:  Patient   Risks discussed:  Incomplete drainage and pain   Alternatives discussed:  No treatment Location:    Type:  Abscess   Location:  Upper extremity   Upper extremity location:  Hand   Hand location:  R hand Pre-procedure details:    Skin preparation:  Betadine Anesthesia (see MAR for exact dosages):    Anesthesia method:  Nerve block   Block needle gauge:  27 G   Block anesthetic:  Lidocaine 1% w/o epi   Block injection procedure:  Anatomic landmarks identified, introduced needle, incremental injection, anatomic landmarks palpated and negative aspiration for blood   Block outcome:  Anesthesia achieved Procedure type:    Complexity:  Simple Procedure details:    Incision types:  Stab incision   Incision depth:  Dermal   Scalpel blade:  11   Wound management:  Probed and deloculated and irrigated with saline   Drainage:  Bloody   Drainage amount:  Scant   Wound treatment:  Wound left open Post-procedure details:    Patient tolerance of procedure:  Tolerated well, no immediate complications   (including critical care  time)  Medications Ordered in ED Medications - No data to display   Initial Impression / Assessment and Plan / ED Course  I have reviewed the triage vital signs and the nursing notes.  Pertinent labs & imaging results that were available during my care of the patient were reviewed by me and considered in my medical decision making (see chart for details).    Patient discussed with and seen by Dr. Gailen Shelter I&D of area, outpatient treatment with clindamycin, close follow-up.   Patient presentation consistent with cellulitis. Afebrile. No tachycardia, hypotension or other symptoms suggestive of severe infection. Area has been demarcated and pt advised to follow up for wound check in 2-3 days,  sooner for worsening systemic symptoms, new lymphangitis, or significant spread of erythema past line of demarcation. Will discharge with doxycycline. Return precautions discussed. Pt appears safe for discharge.   Final Clinical Impressions(s) / ED Diagnoses   Final diagnoses:  Cellulitis of hand, right    ED Discharge Orders        Ordered    clindamycin (CLEOCIN) 300 MG capsule  4 times daily     01/28/17 1845       Etta Quill, NP 01/28/17 1936    Blanchie Dessert, MD 01/29/17 [phone]

## 2017-01-28 NOTE — ED Triage Notes (Signed)
Pt states he noticed area 12/7 to right index finger-denies injury-area is worse with redness up FA-sent to ED by PCP-NAD-steady gait

## 2017-01-31 DIAGNOSIS — J069 Acute upper respiratory infection, unspecified: Secondary | ICD-10-CM | POA: Diagnosis not present

## 2017-01-31 DIAGNOSIS — L03113 Cellulitis of right upper limb: Secondary | ICD-10-CM | POA: Diagnosis not present

## 2017-05-16 DIAGNOSIS — E119 Type 2 diabetes mellitus without complications: Secondary | ICD-10-CM | POA: Diagnosis not present

## 2017-05-16 DIAGNOSIS — Z7984 Long term (current) use of oral hypoglycemic drugs: Secondary | ICD-10-CM | POA: Diagnosis not present

## 2017-05-16 DIAGNOSIS — I10 Essential (primary) hypertension: Secondary | ICD-10-CM | POA: Diagnosis not present

## 2017-05-22 DIAGNOSIS — Z7984 Long term (current) use of oral hypoglycemic drugs: Secondary | ICD-10-CM | POA: Diagnosis not present

## 2017-05-22 DIAGNOSIS — L570 Actinic keratosis: Secondary | ICD-10-CM | POA: Diagnosis not present

## 2017-05-22 DIAGNOSIS — K219 Gastro-esophageal reflux disease without esophagitis: Secondary | ICD-10-CM | POA: Diagnosis not present

## 2017-05-22 DIAGNOSIS — E782 Mixed hyperlipidemia: Secondary | ICD-10-CM | POA: Diagnosis not present

## 2017-05-22 DIAGNOSIS — I10 Essential (primary) hypertension: Secondary | ICD-10-CM | POA: Diagnosis not present

## 2017-05-22 DIAGNOSIS — E1140 Type 2 diabetes mellitus with diabetic neuropathy, unspecified: Secondary | ICD-10-CM | POA: Diagnosis not present

## 2017-05-22 DIAGNOSIS — Z0000 Encounter for general adult medical examination without abnormal findings: Secondary | ICD-10-CM | POA: Diagnosis not present

## 2017-05-22 DIAGNOSIS — E1142 Type 2 diabetes mellitus with diabetic polyneuropathy: Secondary | ICD-10-CM | POA: Diagnosis not present

## 2017-05-22 DIAGNOSIS — M48061 Spinal stenosis, lumbar region without neurogenic claudication: Secondary | ICD-10-CM | POA: Diagnosis not present

## 2017-10-12 IMAGING — CR DG CHEST 2V
2 series · 2 of 2 positions shown · non-contrast
Comparison: No prior.

CLINICAL DATA: Low back pain.

EXAM:
CHEST  2 VIEW

[w chest pa]
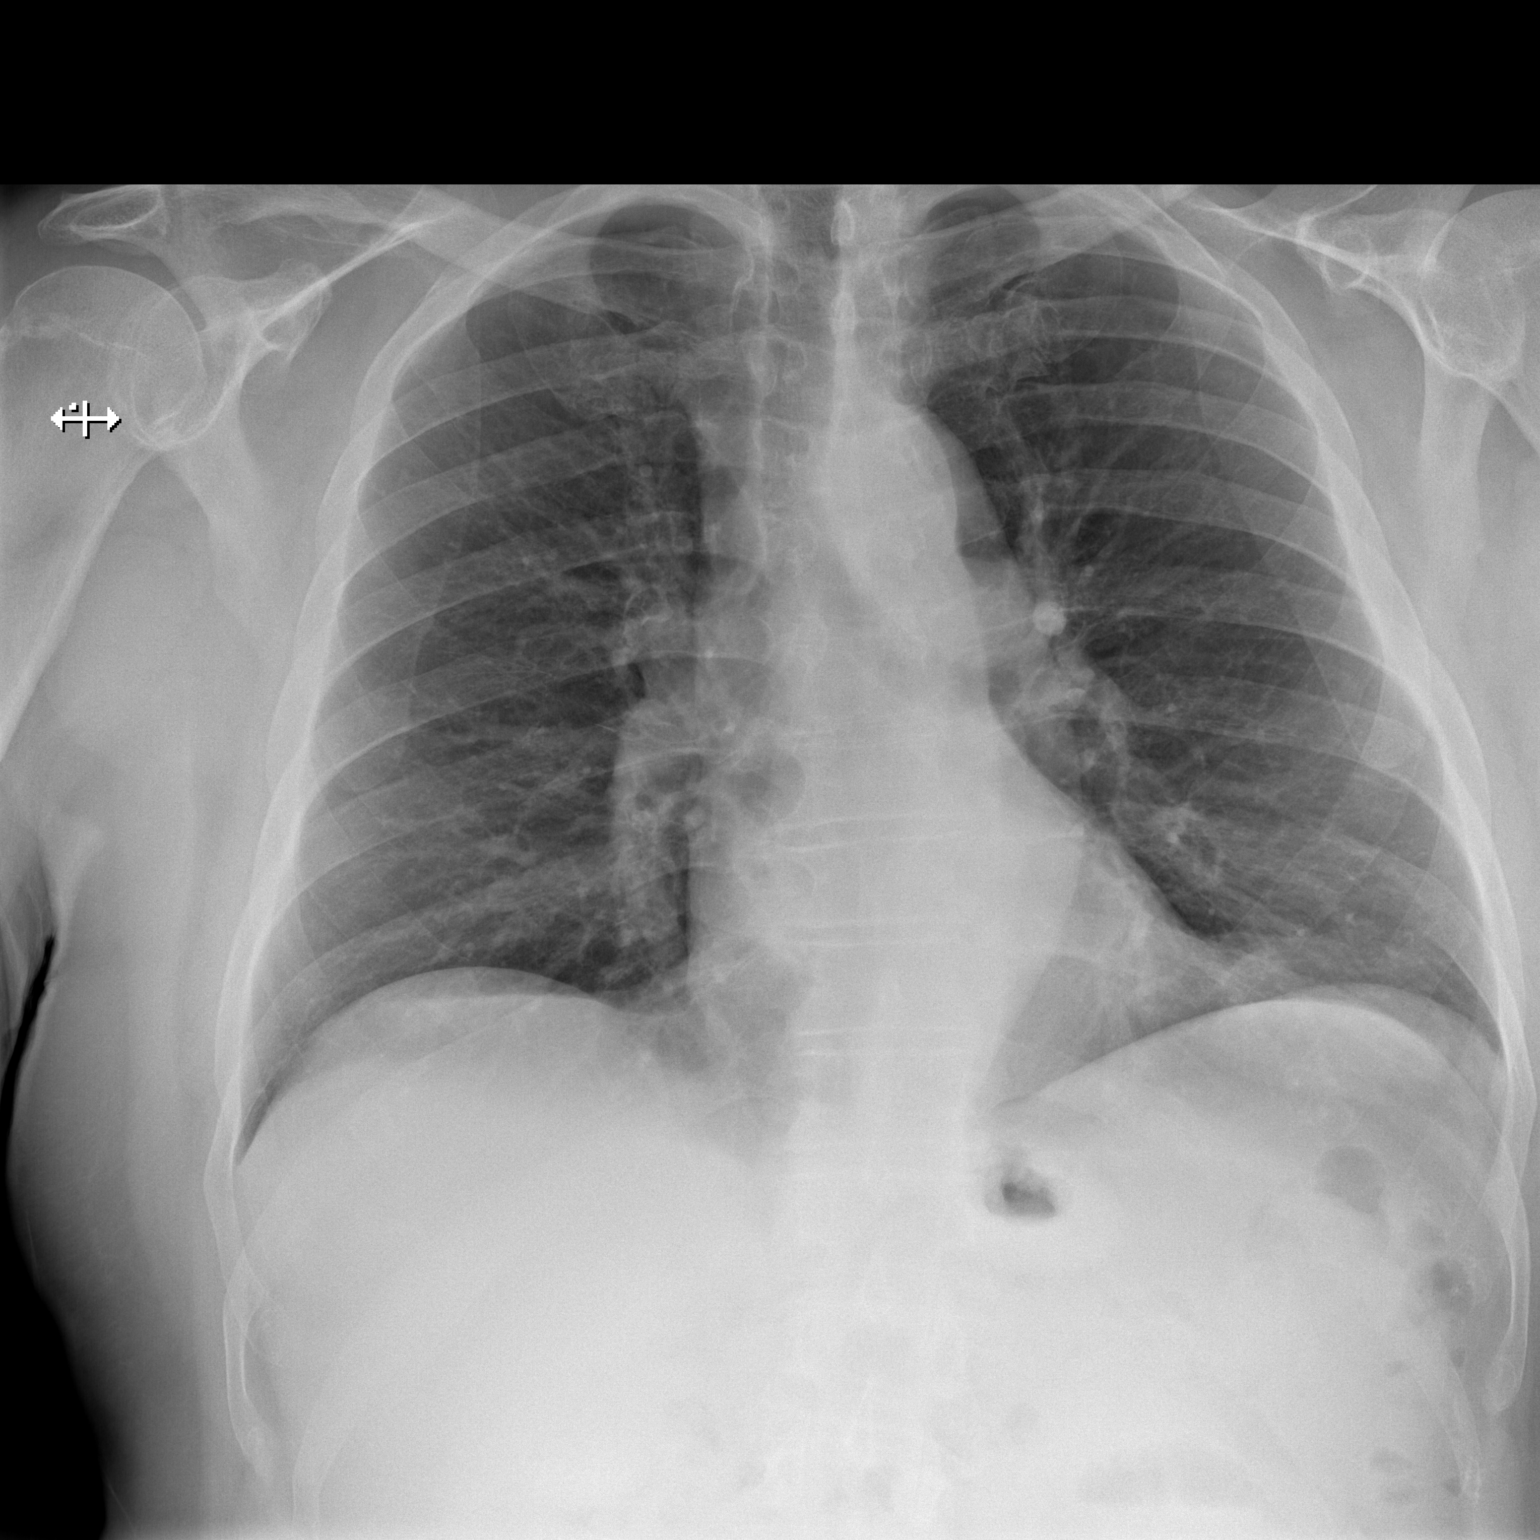

[w chest lat]
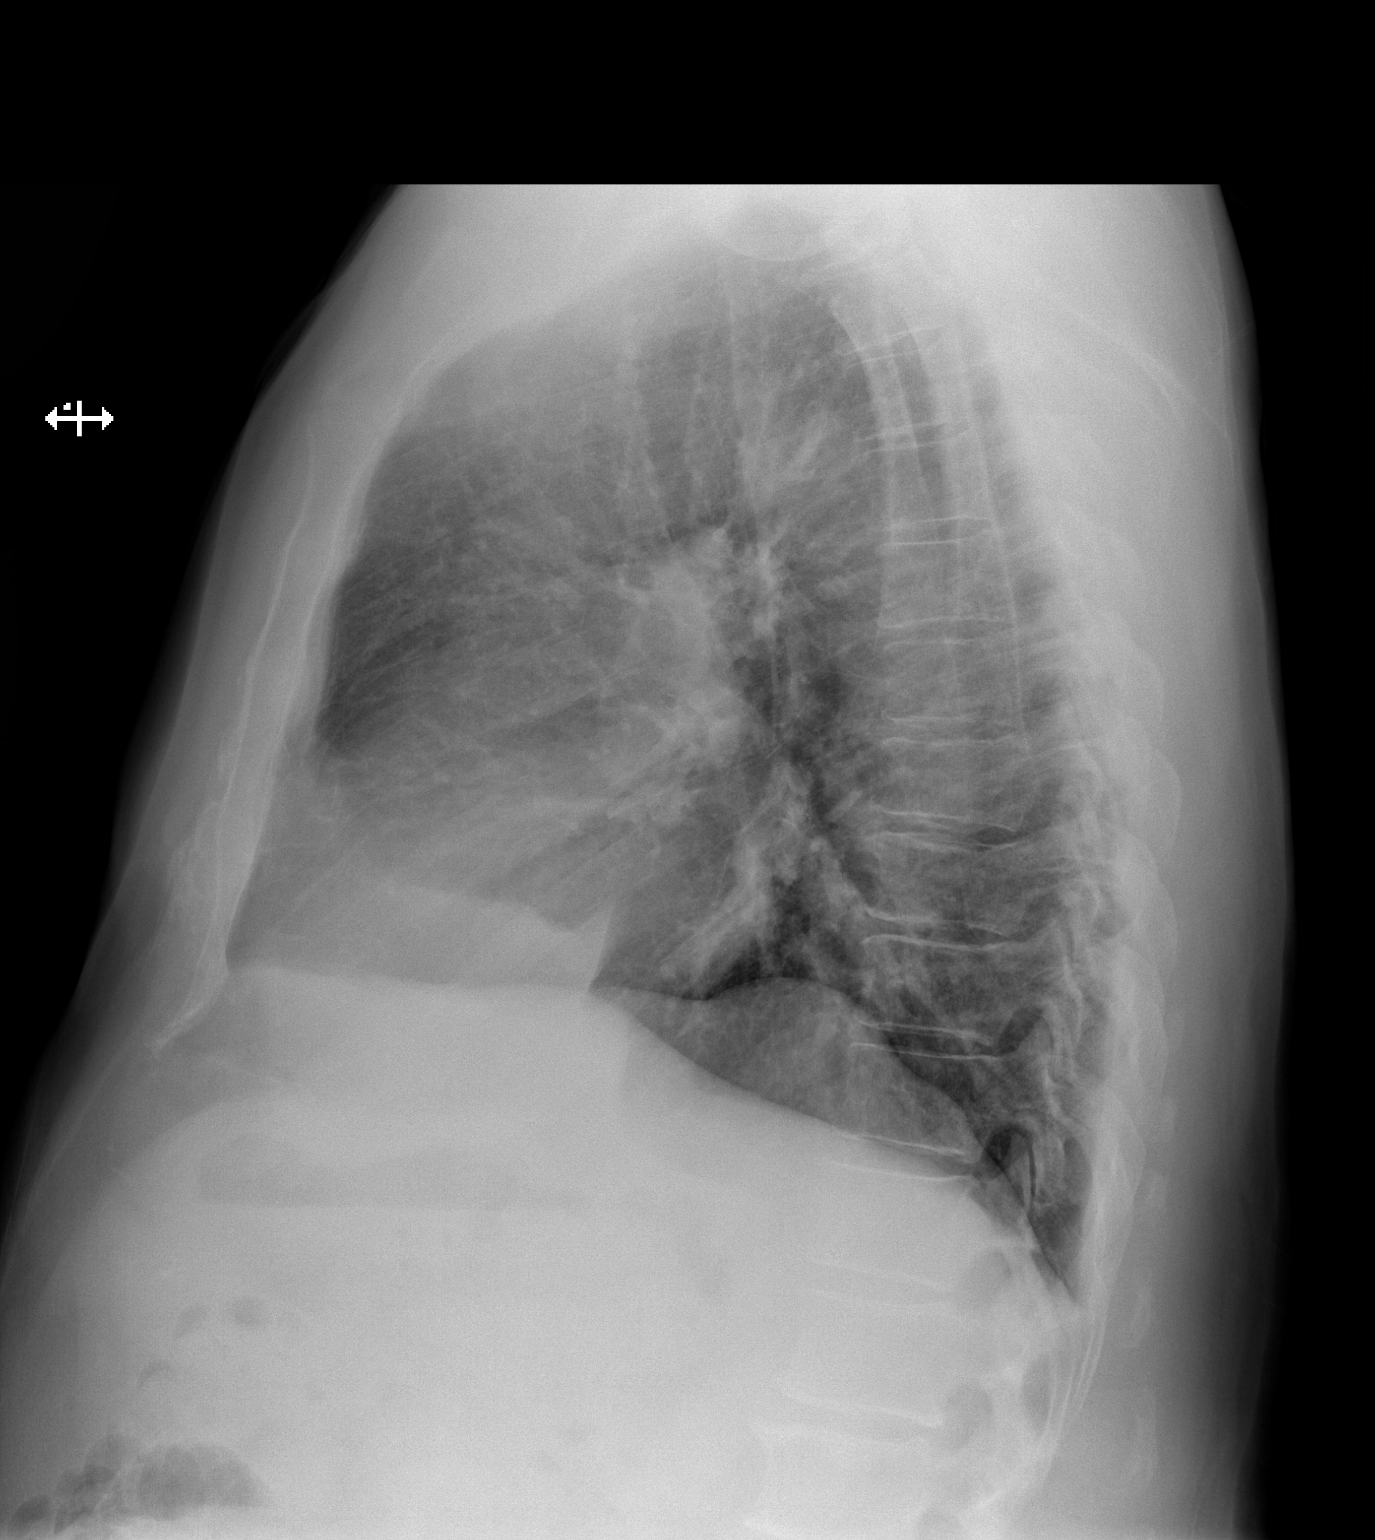

[2 of 2 positions shown; findings below may reference images not displayed]

FINDINGS: Mediastinum hilar structures normal. Low lung volumes with mild
bibasilar atelectasis and/or infiltrates. No pleural effusion or
pneumothorax. Heart size normal. No acute bony abnormality.
IMPRESSION: Lung volumes with mild bibasilar subsegmental atelectasis and/or
infiltrates, otherwise negative chest.

## 2017-10-15 IMAGING — CR DG LUMBAR SPINE 2-3V
2 series · 2 of 2 positions shown · non-contrast
Comparison: CT 09/30/2014.

CLINICAL DATA: Lumbar spine surgery.

EXAM:
LUMBAR SPINE - 2-3 VIEW

[lateral (1 of 2)]
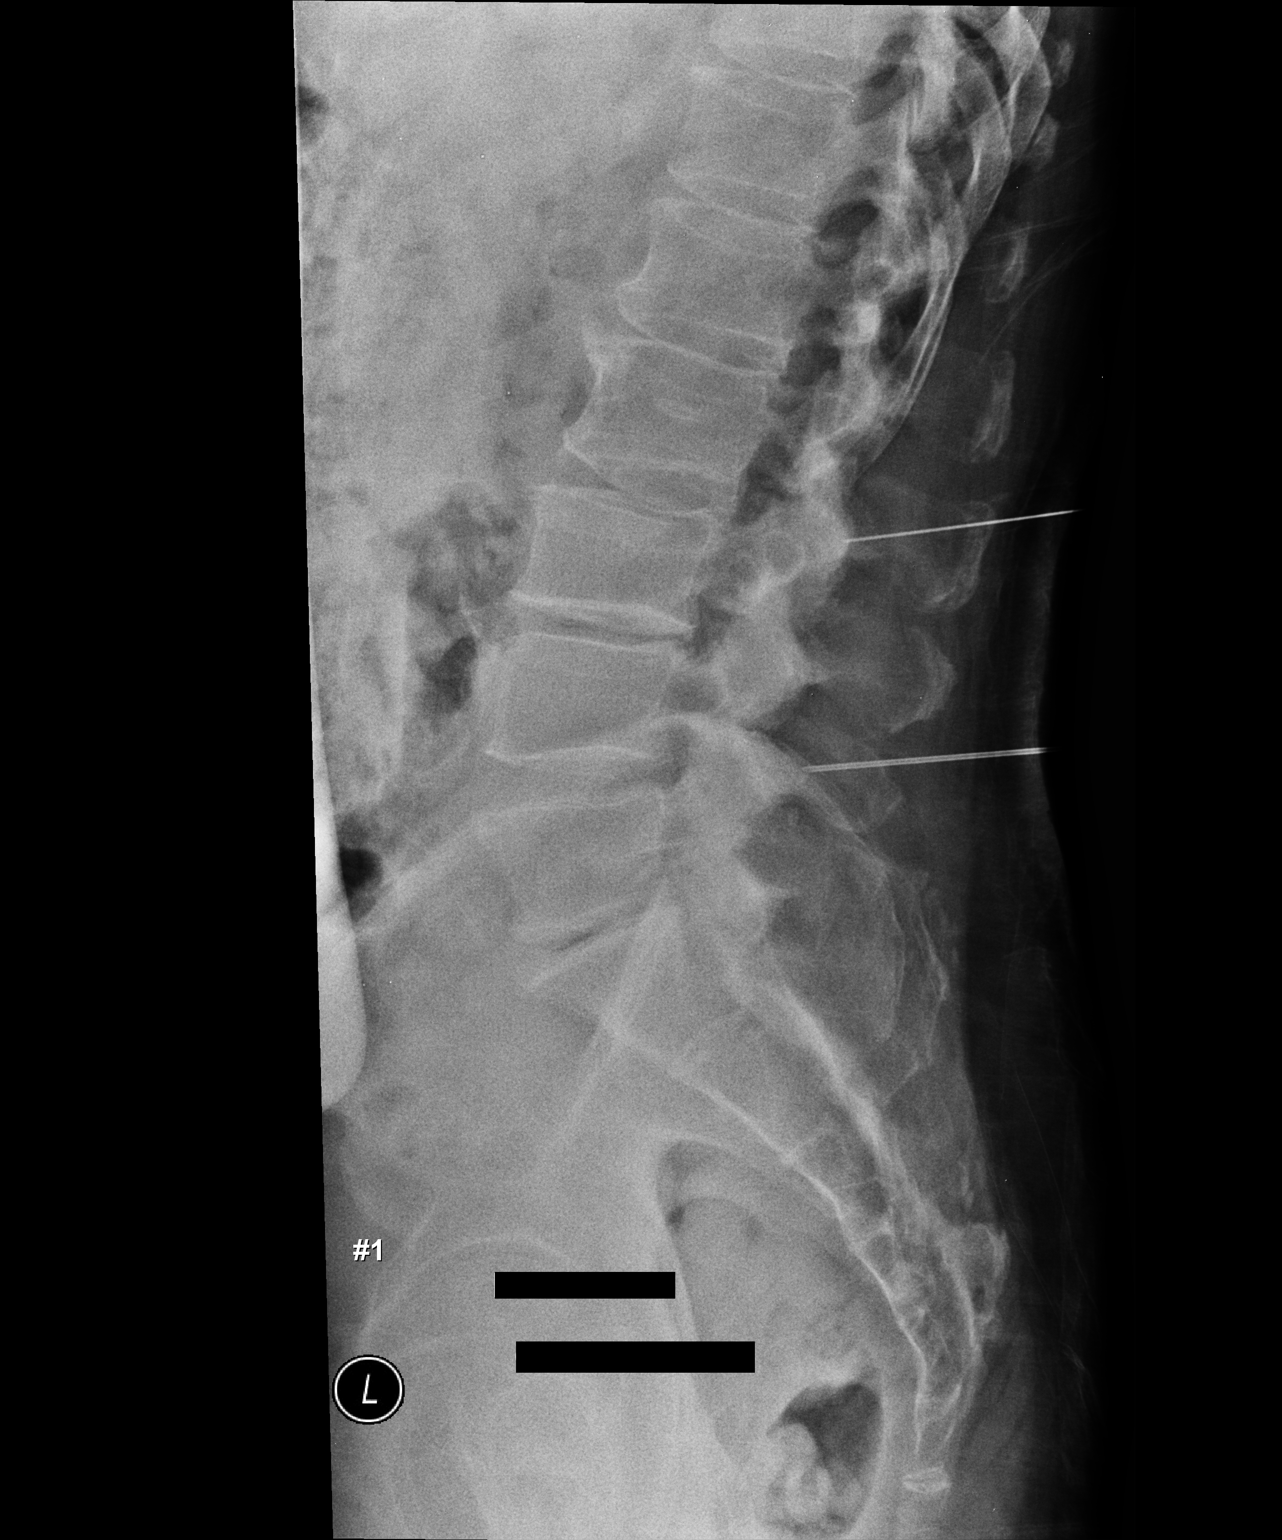

[lateral (2 of 2)]
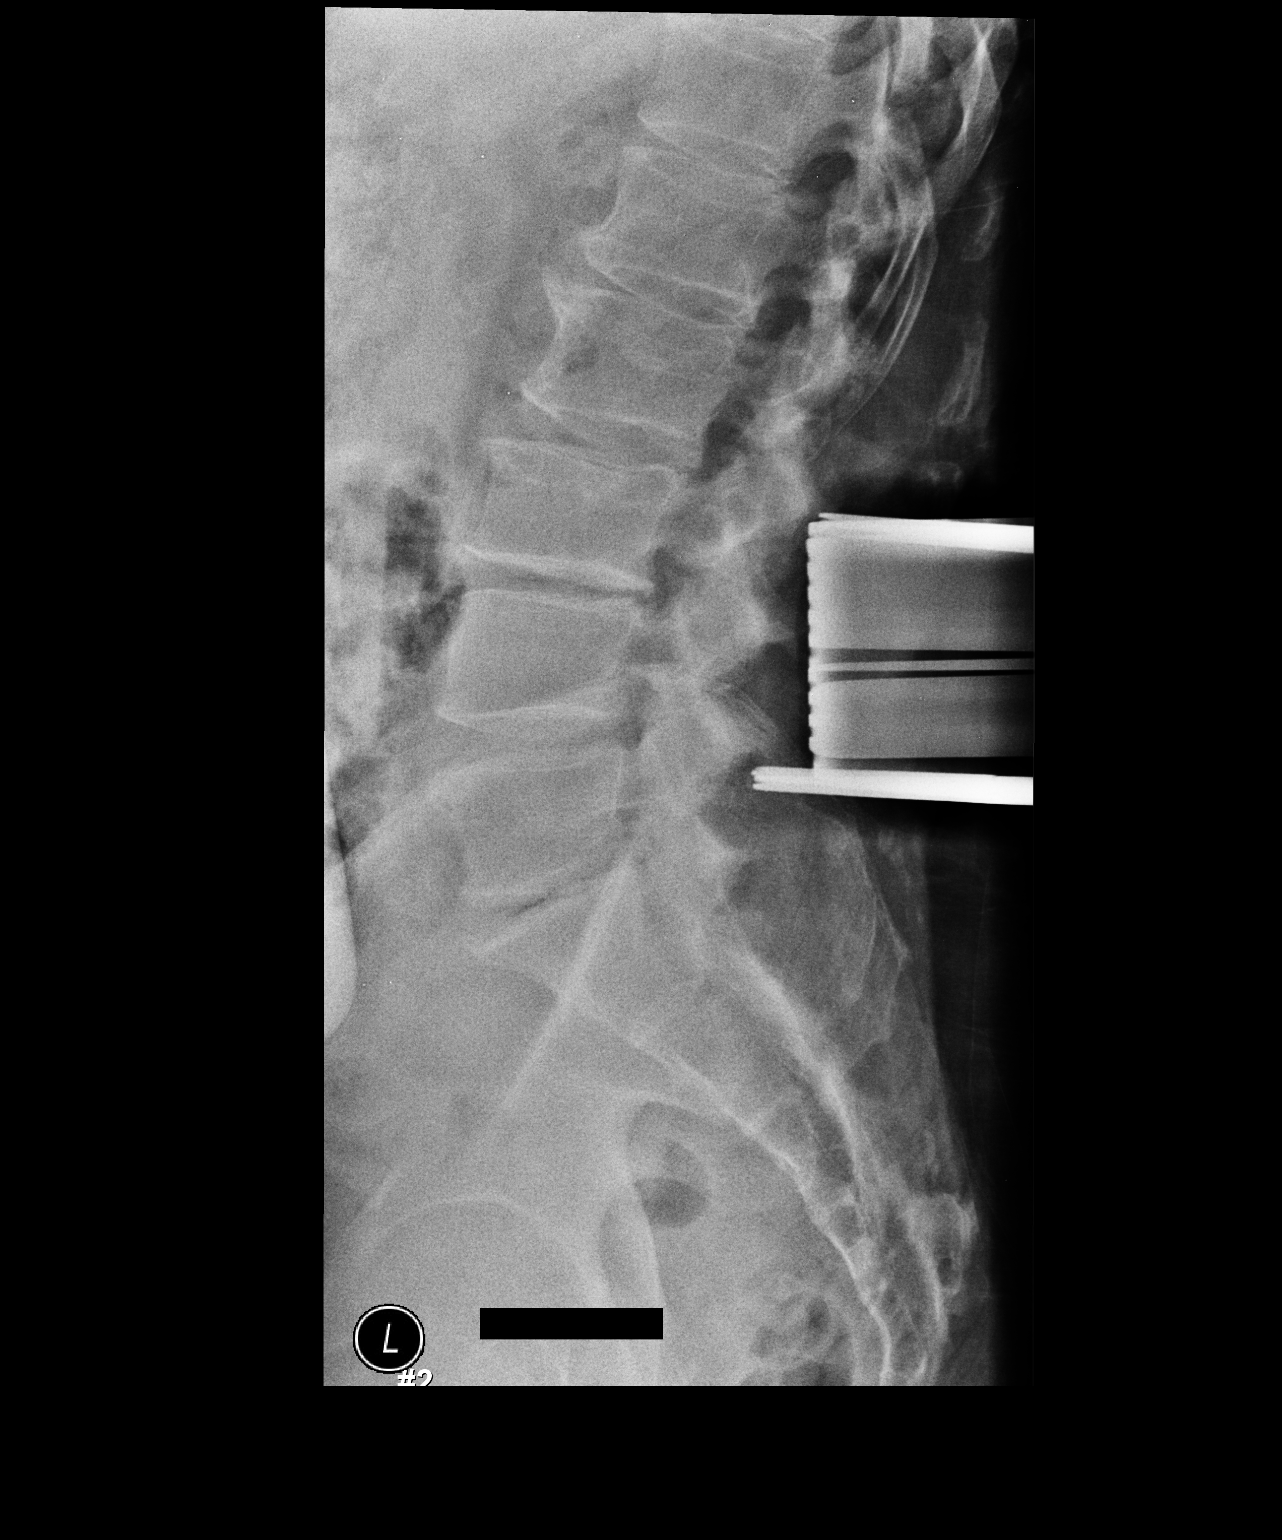

[2 of 2 positions shown; findings below may reference images not displayed]

FINDINGS: Lumbar vertebra number with the lowest segmented lumbar shaped
appearing vertebra as L5. Metallic markers are noted posteriorly at
the L2-L3 and L4-L5 disc space levels on image number 1. On image
number 2 metallic marker noted posteriorly at the L5 level.
IMPRESSION: Metallic markers noted posteriorly at the L2-L3 and L4-L5 disc space
levels on image number 1. On image number 2 metallic marker noted
posteriorly at the L5 level.

## 2017-11-14 DIAGNOSIS — H6692 Otitis media, unspecified, left ear: Secondary | ICD-10-CM | POA: Diagnosis not present

## 2018-01-05 DIAGNOSIS — Z7984 Long term (current) use of oral hypoglycemic drugs: Secondary | ICD-10-CM | POA: Diagnosis not present

## 2018-01-05 DIAGNOSIS — E1140 Type 2 diabetes mellitus with diabetic neuropathy, unspecified: Secondary | ICD-10-CM | POA: Diagnosis not present

## 2018-01-05 DIAGNOSIS — H938X2 Other specified disorders of left ear: Secondary | ICD-10-CM | POA: Diagnosis not present

## 2018-01-05 DIAGNOSIS — Z23 Encounter for immunization: Secondary | ICD-10-CM | POA: Diagnosis not present

## 2018-03-10 ENCOUNTER — Ambulatory Visit (INDEPENDENT_AMBULATORY_CARE_PROVIDER_SITE_OTHER): Payer: Medicare Other | Admitting: Orthopaedic Surgery

## 2018-03-10 ENCOUNTER — Ambulatory Visit (INDEPENDENT_AMBULATORY_CARE_PROVIDER_SITE_OTHER): Payer: Medicare Other

## 2018-03-10 ENCOUNTER — Encounter (INDEPENDENT_AMBULATORY_CARE_PROVIDER_SITE_OTHER): Payer: Self-pay | Admitting: Orthopaedic Surgery

## 2018-03-10 DIAGNOSIS — M25562 Pain in left knee: Secondary | ICD-10-CM | POA: Diagnosis not present

## 2018-03-10 DIAGNOSIS — M25511 Pain in right shoulder: Secondary | ICD-10-CM

## 2018-03-10 DIAGNOSIS — M7541 Impingement syndrome of right shoulder: Secondary | ICD-10-CM

## 2018-03-10 MED ORDER — LIDOCAINE HCL 1 % IJ SOLN
0.5000 mL | INTRAMUSCULAR | Status: AC | PRN
  Administered 2018-03-10: .5 mL

## 2018-03-10 NOTE — Progress Notes (Signed)
Office Visit Note   Patient: Seth Foley           Date of Birth: [birth year]           MRN: [id number] Visit Date: 03/10/2018              Requested by: Seth Foley [place], [place] [address] PCP: Seth Foley   Assessment & Plan: Visit Diagnoses:  1. Right shoulder pain, unspecified chronicity   2. Left knee pain, unspecified chronicity   3. Impingement syndrome of right shoulder     Plan: Left knee aspiration performed clear yellow fluid prepatellar bursa without evidence of infection no cloudiness.  Bursa was flat after aspiration.  He can return if he has recurrent problems.  If the shoulder does not get better he can let us know and consider subacromial cortisone injection.  That is not effective we discussed possible MRI imaging.  He can avoid outstretch and overhead activities and see if his symptoms improve.  Follow-Up Instructions: Return if symptoms worsen or fail to improve.   Orders:  Orders Placed This Encounter  Procedures  . Large Joint Inj  . Incision & Drainage  . Large Joint Inj  . XR KNEE 3 VIEW LEFT  . XR Shoulder Right   No orders of the defined types were placed in this encounter.     Procedures: 63 on 03/10/2018 11:07 AM Indications: joint swelling and pain Details: 22 G 1.5 in needle, anterolateral approach  Arthrogram: No  Medications: 0.5 mL lidocaine 1 % Aspirate: 25 mL yellow Outcome: tolerated well, no immediate complications Procedure, treatment alternatives, risks and benefits explained, specific risks discussed. Consent was given by the patient. Immediately prior to procedure a time out was called to verify the correct patient, procedure, equipment, support staff and site/side marked as required. Patient was prepped and draped in the usual sterile fashion.       Clinical Data: No additional findings.   Subjective: Chief Complaint  Patient presents with  . Right Shoulder - Pain  . Left Knee - Pain      HPI 83 year old male seen with 2 problems.  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  He states when he is dilated more than large as it usually at least the size of a golf ball.  He denies fever or chills.  Patient does have diabetes and is on medication A1c is 07.  Shoulders bother him with outstretched reaching overhead activities having trouble getting his hand above the level of the top of his head.  He did not notice any ecchymosis in his arm or sudden pop.  Review of Systems he was lumbar surgery 2017 by Dr. Lorin Foley L3-4 L4-5 doing well.  Positive for hypertension kidney stones type 2 diabetes on oral medication and BPH otherwise negative is a pertains HPI.   Objective: Vital Signs: There were no vitals taken for this visit.  Physical Exam Constitutional:      Appearance: He is well-developed.  HENT:     Head: Normocephalic and atraumatic.  Eyes:     Pupils: Pupils are equal, round, and reactive to light.  Neck:     Thyroid:  No thyromegaly.     Trachea: No tracheal deviation.  Cardiovascular:     Rate and Rhythm: Normal rate.  Pulmonary:     Effort: Pulmonary effort is normal.     Breath sounds: No wheezing.  Abdominal:     General: Bowel sounds are normal.     Palpations: Abdomen is soft.  Skin:    General: Skin is warm and dry.     Capillary Refill: Capillary refill takes less than 2 seconds.  Neurological:     Mental Status: He is alert and oriented to person, place, and time.  Psychiatric:        Behavior: Behavior normal.        Thought Content: Thought content normal.        Judgment: Judgment normal.     Ortho Exam patient has prepatellar bursitis golf ball sized lesion without erythema or  warmth.  Collateral ligaments of his knee is normal knee joint itself has no effusion.  Distal pulses are intact negative logroll.  Right shoulder shows positive impingement long of the biceps is nontender no distal biceps migration.  With the arm abducted at 90 degrees he takes good resistance.  Internal and external rotation strength is strong. Specialty Comments:  No specialty comments available.  Imaging: Xr Knee 3 View Left  Result Date: 03/10/2018 Three-view x-rays left knee obtained and reviewed.  This shows some mild osteoarthritis with osteophyte formation.  Prepatellar swelling at the patellar tendon insertion over the tibial tubercle is noted. Impression: X-rays negative for acute changes of the bone.  Mild knee OA.  Prepatellar bursa soft tissue swelling noted on the lateral image  Xr Shoulder Right  Result Date: 03/10/2018 Three-view x-rays right shoulder obtained and reviewed.  This shows mild glenoid spurring.  Negative for acute changes.  Mild acromioclavicular narrowing. Impression: Right shoulder x-rays negative for acute changes.    PMFS History: Patient Active Problem List   Diagnosis Date Noted  . History of lumbar laminectomy for spinal cord decompression 05/19/2015   Past Medical History:  Diagnosis Date  . BPH (benign prostatic hyperplasia)   . Chronic lower back pain   . High cholesterol   . Hypertension   . Kidney stones   . Prostate infection   . Skin cancer    "temples and scalp" (05/19/2015)  . Type II diabetes mellitus ([facility])     No family history on file.  Past Surgical History:  Procedure Laterality Date  . APPENDECTOMY    . BACK SURGERY    . COLONOSCOPY W/ POLYPECTOMY    . DECOMPRESSIVE LUMBAR LAMINECTOMY LEVEL 2  05/19/2015   L3-4, 4-5  . FRACTURE SURGERY    . HYDROCELE EXCISION    . KNEE ARTHROSCOPY Bilateral   . LITHOTRIPSY  X 2  . LUMBAR DISC SURGERY     "herniated disc"  . LUMBAR LAMINECTOMY/DECOMPRESSION MICRODISCECTOMY N/A 05/19/2015     Procedure: L3-4, L4-5 Decompression;  Surgeon: Seth Killings, Foley;  Location: [facility];  Service: Orthopedics;  Laterality: N/A;  . NASAL RECONSTRUCTION     Social History   Occupational History  . Not on file  Tobacco Use  . Smoking status: Former Smoker    Packs/day: 1.00    Years: 18.00    Pack years: 18.00    Types: Cigarettes  . Smokeless tobacco: Former Systems developer  . Tobacco comment: quit ismoking n 1971; quit chewing in 1972  Substance and Sexual Activity  . Alcohol use: No  . Drug use:  No  . Sexual activity: Not on file

## 2018-06-26 DIAGNOSIS — E1142 Type 2 diabetes mellitus with diabetic polyneuropathy: Secondary | ICD-10-CM | POA: Diagnosis not present

## 2018-06-26 DIAGNOSIS — L570 Actinic keratosis: Secondary | ICD-10-CM | POA: Diagnosis not present

## 2018-06-26 DIAGNOSIS — Z0000 Encounter for general adult medical examination without abnormal findings: Secondary | ICD-10-CM | POA: Diagnosis not present

## 2018-06-26 DIAGNOSIS — I10 Essential (primary) hypertension: Secondary | ICD-10-CM | POA: Diagnosis not present

## 2018-06-26 DIAGNOSIS — E782 Mixed hyperlipidemia: Secondary | ICD-10-CM | POA: Diagnosis not present

## 2018-06-26 DIAGNOSIS — H9190 Unspecified hearing loss, unspecified ear: Secondary | ICD-10-CM | POA: Diagnosis not present

## 2018-06-26 DIAGNOSIS — E1140 Type 2 diabetes mellitus with diabetic neuropathy, unspecified: Secondary | ICD-10-CM | POA: Diagnosis not present

## 2018-07-03 DIAGNOSIS — Z7984 Long term (current) use of oral hypoglycemic drugs: Secondary | ICD-10-CM | POA: Diagnosis not present

## 2018-07-03 DIAGNOSIS — E119 Type 2 diabetes mellitus without complications: Secondary | ICD-10-CM | POA: Diagnosis not present

## 2018-12-08 DIAGNOSIS — E1142 Type 2 diabetes mellitus with diabetic polyneuropathy: Secondary | ICD-10-CM | POA: Diagnosis not present

## 2018-12-08 DIAGNOSIS — E782 Mixed hyperlipidemia: Secondary | ICD-10-CM | POA: Diagnosis not present

## 2018-12-08 DIAGNOSIS — E119 Type 2 diabetes mellitus without complications: Secondary | ICD-10-CM | POA: Diagnosis not present

## 2018-12-08 DIAGNOSIS — I10 Essential (primary) hypertension: Secondary | ICD-10-CM | POA: Diagnosis not present

## 2018-12-08 DIAGNOSIS — E1140 Type 2 diabetes mellitus with diabetic neuropathy, unspecified: Secondary | ICD-10-CM | POA: Diagnosis not present

## 2018-12-08 DIAGNOSIS — Z7984 Long term (current) use of oral hypoglycemic drugs: Secondary | ICD-10-CM | POA: Diagnosis not present

## 2018-12-22 DIAGNOSIS — Z23 Encounter for immunization: Secondary | ICD-10-CM | POA: Diagnosis not present

## 2018-12-24 DIAGNOSIS — E782 Mixed hyperlipidemia: Secondary | ICD-10-CM | POA: Diagnosis not present

## 2018-12-24 DIAGNOSIS — E1140 Type 2 diabetes mellitus with diabetic neuropathy, unspecified: Secondary | ICD-10-CM | POA: Diagnosis not present

## 2018-12-24 DIAGNOSIS — I10 Essential (primary) hypertension: Secondary | ICD-10-CM | POA: Diagnosis not present

## 2019-01-21 DIAGNOSIS — E1140 Type 2 diabetes mellitus with diabetic neuropathy, unspecified: Secondary | ICD-10-CM | POA: Diagnosis not present

## 2019-02-19 ENCOUNTER — Emergency Department (HOSPITAL_COMMUNITY)
Admission: EM | Admit: 2019-02-19 | Discharge: 2019-02-19 | Disposition: A | Discharge status: Home / Self Care | Payer: Medicare Other | Attending: Emergency Medicine | Admitting: Emergency Medicine

## 2019-02-19 ENCOUNTER — Emergency Department (HOSPITAL_COMMUNITY): Payer: Medicare Other

## 2019-02-19 ENCOUNTER — Other Ambulatory Visit: Payer: Self-pay

## 2019-02-19 ENCOUNTER — Encounter (HOSPITAL_COMMUNITY): Payer: Self-pay | Admitting: Emergency Medicine

## 2019-02-19 DIAGNOSIS — Z7984 Long term (current) use of oral hypoglycemic drugs: Secondary | ICD-10-CM | POA: Diagnosis not present

## 2019-02-19 DIAGNOSIS — W010XXA Fall on same level from slipping, tripping and stumbling without subsequent striking against object, initial encounter: Secondary | ICD-10-CM | POA: Insufficient documentation

## 2019-02-19 DIAGNOSIS — Y999 Unspecified external cause status: Secondary | ICD-10-CM | POA: Diagnosis not present

## 2019-02-19 DIAGNOSIS — Y929 Unspecified place or not applicable: Secondary | ICD-10-CM | POA: Insufficient documentation

## 2019-02-19 DIAGNOSIS — Y9389 Activity, other specified: Secondary | ICD-10-CM | POA: Insufficient documentation

## 2019-02-19 DIAGNOSIS — Z87891 Personal history of nicotine dependence: Secondary | ICD-10-CM | POA: Diagnosis not present

## 2019-02-19 DIAGNOSIS — E119 Type 2 diabetes mellitus without complications: Secondary | ICD-10-CM | POA: Insufficient documentation

## 2019-02-19 DIAGNOSIS — S4992XA Unspecified injury of left shoulder and upper arm, initial encounter: Secondary | ICD-10-CM | POA: Diagnosis not present

## 2019-02-19 DIAGNOSIS — Z79899 Other long term (current) drug therapy: Secondary | ICD-10-CM | POA: Diagnosis not present

## 2019-02-19 DIAGNOSIS — M25512 Pain in left shoulder: Secondary | ICD-10-CM | POA: Insufficient documentation

## 2019-02-19 DIAGNOSIS — I10 Essential (primary) hypertension: Secondary | ICD-10-CM | POA: Diagnosis not present

## 2019-02-19 DIAGNOSIS — Z85828 Personal history of other malignant neoplasm of skin: Secondary | ICD-10-CM | POA: Insufficient documentation

## 2019-02-19 MED ORDER — HYDROCODONE-ACETAMINOPHEN 5-325 MG PO TABS: 1.0000 | ORAL_TABLET | ORAL | 0 refills | Status: AC | PRN

## 2019-02-19 NOTE — ED Provider Notes (Signed)
[facility] DEPT Provider Note   CSN: [id number] Arrival date & time: 02/19/19  [id number]     History Chief Complaint  Patient presents with  . Fall  . Shoulder Pain    Seth Foley is a 84 y.o. male.  Accidental trip and fall yesterday while hunting striking his left shoulder.  No obvious elbow, wrist, hand injury.  No head or neck trauma.  Pain is worse with range of motion.  Severity is moderate.        Past Medical History:  Diagnosis Date  . BPH (benign prostatic hyperplasia)   . Chronic lower back pain   . High cholesterol   . Hypertension   . Kidney stones   . Prostate infection   . Skin cancer    "temples and scalp" (05/19/2015)  . Type II diabetes mellitus [facility])     Patient Active Problem List   Diagnosis Date Noted  . History of lumbar laminectomy for spinal cord decompression 05/19/2015    Past Surgical History:  Procedure Laterality Date  . APPENDECTOMY    . BACK SURGERY    . COLONOSCOPY W/ POLYPECTOMY    . DECOMPRESSIVE LUMBAR LAMINECTOMY LEVEL 2  05/19/2015   L3-4, 4-5  . FRACTURE SURGERY    . HYDROCELE EXCISION    . KNEE ARTHROSCOPY Bilateral   . LITHOTRIPSY  X 2  . LUMBAR DISC SURGERY     "herniated disc"  . LUMBAR LAMINECTOMY/DECOMPRESSION MICRODISCECTOMY N/A 05/19/2015   Procedure: L3-4, L4-5 Decompression;  Surgeon: Marybelle Killings, MD;  Location: [place];  Service: Orthopedics;  Laterality: N/A;  . NASAL RECONSTRUCTION         No family history on file.  Social History   Tobacco Use  . Smoking status: Former Smoker    Packs/day: 1.00    Years: 18.00    Pack years: 18.00    Types: Cigarettes  . Smokeless tobacco: Former Systems developer  . Tobacco comment: quit ismoking n 1971; quit chewing in 1972  Substance Use Topics  . Alcohol use: No  . Drug use: No    Home Medications Prior to Admission medications   Medication Sig Start Date End Date Taking? Authorizing Provider  HYDROcodone-acetaminophen (NORCO/VICODIN)  5-325 MG tablet Take 1 tablet by mouth every 4 (four) hours as needed. One tab q4h prn pain 02/19/19   Nat Christen, MD  losartan (COZAAR) 100 MG tablet Take 100 mg by mouth daily.  09/23/14   [provider]  metFORMIN (GLUCOPHAGE) 500 MG tablet Take 500 mg by mouth daily with breakfast.  10/27/14   [provider]  Polyethyl Glycol-Propyl Glycol (SYSTANE OP) Place 1 drop into both eyes daily as needed (dry eyes).    [provider]  simvastatin (ZOCOR) 20 MG tablet Take 20 mg by mouth at bedtime.  09/27/14   [provider]    Allergies    Morphine and related  Review of Systems   Review of Systems  All other systems reviewed and are negative.   Physical Exam Updated Vital Signs BP (!) 157/75 (BP Location: Right Arm)   Pulse 67   Temp 97.9 F (36.6 C) (Oral)   Resp 16   SpO2 97%   Physical Exam Vitals and nursing note reviewed.  Constitutional:      Appearance: He is well-developed.     Comments: nad  HENT:     Head: Normocephalic and atraumatic.  Eyes:     Conjunctiva/sclera: Conjunctivae normal.  Cardiovascular:  Rate and Rhythm: Normal rate.  Pulmonary:     Effort: Pulmonary effort is normal.  Musculoskeletal:     Cervical back: Neck supple.     Comments: Left upper extremity: Pain with range of motion surrounding the rotator cuff.  Normal range of motion of elbow, hand, wrist.  Skin:    General: Skin is warm and dry.  Neurological:     General: No focal deficit present.     Mental Status: He is alert and oriented to person, place, and time.  Psychiatric:        Behavior: Behavior normal.     ED Results / Procedures / Treatments   Labs (all labs ordered are listed, but only abnormal results are displayed) Labs Reviewed - No data to display  EKG None  Radiology DG Shoulder Left  Result Date: 02/19/2019 CLINICAL DATA:  Fall onto shoulder, shoulder pain EXAM: LEFT SHOULDER - 2+ VIEW COMPARISON:  None. FINDINGS: No acute  fracture or dislocation. Mild-to-moderate degenerative changes of the glenohumeral and acromioclavicular joints. Streaky left basilar opacity, which may reflect atelectasis on this non inspiratory exam. No acute soft tissue findings. IMPRESSION: 1. No acute fracture or dislocation. 2. Mild-to-moderate degenerative changes of the glenohumeral and acromioclavicular joints. 3. Streaky left basilar opacity, which may reflect atelectasis on this non inspiratory exam. Electronically Signed   By: Davina Poke D.O.   On: 02/19/2019 09:39    Procedures Procedures (including critical care time)  Medications Ordered in ED Medications - No data to display  ED Course  I have reviewed the triage vital signs and the nursing notes.  Pertinent labs & imaging results that were available during my care of the patient were reviewed by me and considered in my medical decision making (see chart for details).    MDM Rules/Calculators/A&P                      Plain films of left shoulder negative.  Sling, ice, Tylenol and/or ibuprofen.  Rx Vicodin (#15) Final Clinical Impression(s) / ED Diagnoses Final diagnoses:  Acute pain of left shoulder    Rx / DC Orders ED Discharge Orders         Ordered    HYDROcodone-acetaminophen (NORCO/VICODIN) 5-325 MG tablet  Every 4 hours PRN     02/19/19 1003           Nat Christen, MD 02/19/19 1007

## 2019-02-19 NOTE — Discharge Instructions (Addendum)
X-ray showed no fracture.  Sling, ice, ibuprofen or Tylenol.  Prescription for pain medicine sent to your pharmacy.

## 2019-02-19 NOTE — ED Triage Notes (Addendum)
Per pt, states he tripped and fell injurying his left shoulder-states increased pain, decreased movement

## 2019-02-23 ENCOUNTER — Encounter: Payer: Self-pay | Admitting: Orthopaedic Surgery

## 2019-02-23 ENCOUNTER — Other Ambulatory Visit: Payer: Self-pay

## 2019-02-23 ENCOUNTER — Ambulatory Visit (INDEPENDENT_AMBULATORY_CARE_PROVIDER_SITE_OTHER): Payer: Medicare Other | Admitting: Orthopaedic Surgery

## 2019-02-23 DIAGNOSIS — S40012A Contusion of left shoulder, initial encounter: Secondary | ICD-10-CM | POA: Diagnosis not present

## 2019-02-23 NOTE — Progress Notes (Signed)
Office Visit Note   Patient: Seth Foley           Date of Birth: 25-Aug-1935           MRN: [id number] Visit Date: 02/23/2019              Requested by: Hulan Fess, MD [place],   [id number] PCP: Hulan Fess, MD   Assessment & Plan: Visit Diagnoses:  1. Contusion of left shoulder, initial encounter     Plan: Fall with shoulder contusion decreased range of motion.  I am concerned he may have torn his supraspinatus tendon with the fall.  We will set him up for some physical therapy and recheck him in 4 weeks.  He can work on gentle range of motion exercises as instructed by the therapist and gradually increase his activities with the shoulder as tolerated.  Recheck 4 weeks.  Follow-Up Instructions: No follow-ups on file.   Orders:  No orders of the defined types were placed in this encounter.  No orders of the defined types were placed in this encounter.     Procedures: No procedures performed   Clinical Data: No additional findings.   Subjective: Chief Complaint  Patient presents with  . Left Shoulder - Pain    DOI 02/18/2019    HPI 84 year old long-term patient is here for an injury to his left shoulder on 01/3119 when he was deer hunting tripped and fell landing on his left shoulder.  He states since that time has not been able move his arm up over his head he was seen in the emergency room where x-rays were negative for fracture.  Patient does have diabetes he is not sure of his A1c but states his diabetes has been doing better.  No previous history of shoulder surgery.  He had a little discomfort in his neck but this is improved.  He denies numbness or tingling in his fingers no gait disturbance.  X-rays on 02/19/2019 showed no acute fracture dislocation moderate degenerative changes of the glenohumeral joint and AC joint.  Slight basilar opacity suggesting atelectasis.  Review of Systems Previous L3-4 L4-5 decompression for stenosis 2017.   Prior L5-S1 surgery.  Plus for diabetes on Glucophage.  Positive for hypertension and high cholesterol.  Objective: Vital Signs: Ht 5\' 9"  (1.753 m)   Wt 201 lb (91.2 kg)   BMI 29.68 kg/m   Physical Exam Constitutional:      Appearance: He is well-developed.  HENT:     Head: Normocephalic and atraumatic.  Eyes:     Pupils: Pupils are equal, round, and reactive to light.  Neck:     Thyroid: No thyromegaly.     Trachea: No tracheal deviation.  Cardiovascular:     Rate and Rhythm: Normal rate.  Pulmonary:     Effort: Pulmonary effort is normal.     Breath sounds: No wheezing.  Abdominal:     General: Bowel sounds are normal.     Palpations: Abdomen is soft.  Skin:    General: Skin is warm and dry.     Capillary Refill: Capillary refill takes less than 2 seconds.  Neurological:     Mental Status: He is alert and oriented to person, place, and time.  Psychiatric:        Behavior: Behavior normal.        Thought Content: Thought content normal.        Judgment: Judgment normal.     Ortho Exam  negative supraspinatus atrophy.  No brachial plexus tenderness.  Negative Spurling.  No distal migration of the biceps muscle no tenderness over the long head of the biceps.Left shoulder,   He has positive Neer test negative Hawkins test.  He can abduct just to 80 degrees with pain.  Acromioclavicular joint is mildly tender negative crossarm test.  Sensation hand is intact.  No thenar or hypothenar atrophy.  Specialty Comments:  No specialty comments available.  Imaging: No results found.   PMFS History: Patient Active Problem List   Diagnosis Date Noted  . Contusion of left shoulder 02/23/2019  . History of lumbar laminectomy for spinal cord decompression 05/19/2015   Past Medical History:  Diagnosis Date  . BPH (benign prostatic hyperplasia)   . Chronic lower back pain   . High cholesterol   . Hypertension   . Kidney stones   . Prostate infection   . Skin cancer     "temples and scalp" (05/19/2015)  . Type II diabetes mellitus ([place])     No family history on file.  Past Surgical History:  Procedure Laterality Date  . APPENDECTOMY    . BACK SURGERY    . COLONOSCOPY W/ POLYPECTOMY    . DECOMPRESSIVE LUMBAR LAMINECTOMY LEVEL 2  05/19/2015   L3-4, 4-5  . FRACTURE SURGERY    . HYDROCELE EXCISION    . KNEE ARTHROSCOPY Bilateral   . LITHOTRIPSY  X 2  . LUMBAR DISC SURGERY     "herniated disc"  . LUMBAR LAMINECTOMY/DECOMPRESSION MICRODISCECTOMY N/A 05/19/2015   Procedure: L3-4, L4-5 Decompression;  Surgeon: Marybelle Killings, MD;  Location: [facility];  Service: Orthopedics;  Laterality: N/A;  . NASAL RECONSTRUCTION     Social History   Occupational History  . Not on file  Tobacco Use  . Smoking status: Former Smoker    Packs/day: 1.00    Years: 18.00    Pack years: 18.00    Types: Cigarettes  . Smokeless tobacco: Former Systems developer  . Tobacco comment: quit ismoking n 1971; quit chewing in 1972  Substance and Sexual Activity  . Alcohol use: No  . Drug use: No  . Sexual activity: Not on file

## 2019-02-23 NOTE — Addendum Note (Signed)
Addended by: Meyer Cory on: 02/23/2019 10:55 AM   Modules accepted: Orders

## 2019-03-12 ENCOUNTER — Ambulatory Visit: Payer: Medicare Other | Admitting: Physical Therapy

## 2019-03-17 ENCOUNTER — Other Ambulatory Visit: Payer: Self-pay

## 2019-03-17 ENCOUNTER — Encounter: Payer: Self-pay | Admitting: Physical Therapy

## 2019-03-17 ENCOUNTER — Ambulatory Visit (INDEPENDENT_AMBULATORY_CARE_PROVIDER_SITE_OTHER): Payer: Medicare Other | Admitting: Physical Therapy

## 2019-03-17 DIAGNOSIS — M6281 Muscle weakness (generalized): Secondary | ICD-10-CM

## 2019-03-17 DIAGNOSIS — R293 Abnormal posture: Secondary | ICD-10-CM

## 2019-03-17 DIAGNOSIS — M25512 Pain in left shoulder: Secondary | ICD-10-CM | POA: Diagnosis not present

## 2019-03-17 NOTE — Patient Instructions (Signed)
Access Code: [email or web address]  URL: [email or web address]  Date: 03/17/2019  Prepared by: Faustino Congress   Exercises Standing Shoulder Row with Anchored Resistance - 10 reps - 1 sets - 5 sec hold - 2x daily - 7x weekly Sidelying Shoulder Abduction Palm Forward - 10 reps - 1 sets - 2x daily - 7x weekly Standing Shoulder Horizontal Abduction - Palms Down - 10 reps - 1 sets - 2x daily - 7x weekly

## 2019-03-17 NOTE — Therapy (Signed)
[facility] Physical Therapy [address] [place], [place], [address] Phone: [phone]   Fax:  [phone]  Physical Therapy Evaluation  Patient Details  Name: Seth Foley MRN: [id number] Date of Birth: [birth year] Referring Provider (PT): Marybelle Killings, MD   Encounter Date: 03/17/2019  PT End of Session - 03/17/19 1135    Visit Number  1    Number of Visits  6    Date for PT Re-Evaluation  04/28/19    PT Start Time  1103    PT Stop Time  1133    PT Time Calculation (min)  30 min    Activity Tolerance  Patient tolerated treatment well    Behavior During Therapy  [facility] for tasks assessed/performed       Past Medical History:  Diagnosis Date  . BPH (benign prostatic hyperplasia)   . Chronic lower back pain   . High cholesterol   . Hypertension   . Kidney stones   . Prostate infection   . Skin cancer    "temples and scalp" (05/19/2015)  . Type II diabetes mellitus ([place])     Past Surgical History:  Procedure Laterality Date  . APPENDECTOMY    . BACK SURGERY    . COLONOSCOPY W/ POLYPECTOMY    . DECOMPRESSIVE LUMBAR LAMINECTOMY LEVEL 2  05/19/2015   L3-4, 4-5  . FRACTURE SURGERY    . HYDROCELE EXCISION    . KNEE ARTHROSCOPY Bilateral   . LITHOTRIPSY  X 2  . LUMBAR DISC SURGERY     "herniated disc"  . LUMBAR LAMINECTOMY/DECOMPRESSION MICRODISCECTOMY N/A 05/19/2015   Procedure: L3-4, L4-5 Decompression;  Surgeon: Marybelle Killings, MD;  Location: [place];  Service: Orthopedics;  Laterality: N/A;  . NASAL RECONSTRUCTION      There were no vitals filed for this visit.   Subjective Assessment - 03/17/19 1106    Subjective  Pt is an 84 y/o male who reports on 02/18/19 was deer hunting, tripped over a wire or vine with Lt shoulder taking most of the impact.  Pt reports improvement in symtpoms at this time, but still having trouble with abduction.    Pertinent History  HLD, HTN, DM, lumbar laminectomy    Limitations  Lifting    Patient Stated Goals  improve  motion, pain    Currently in Pain?  Yes    Pain Score  5    up to 10/10; at best 0/10   Pain Location  Shoulder    Pain Orientation  Left    Pain Descriptors / Indicators  Sharp;Shooting    Pain Type  Acute pain    Pain Onset  1 to 4 weeks ago    Pain Frequency  Intermittent    Aggravating Factors   raising, reaching overhead    Pain Relieving Factors  supine with arm up overhead, medication         [facility] PT Assessment - 03/17/19 1110      Assessment   Medical Diagnosis  Lt shoulder pain    Referring Provider (PT)  Marybelle Killings, MD    Onset Date/Surgical Date  02/18/19    Hand Dominance  Right    Next MD Visit  PRN    Prior Therapy  none      Precautions   Precautions  None      Restrictions   Weight Bearing Restrictions  No      Balance Screen   Has the patient fallen in the past 6  months  Yes    How many times?  1    Has the patient had a decrease in activity level because of a fear of falling?   No    Is the patient reluctant to leave their home because of a fear of falling?   No      Home Film/video editor residence    Living Arrangements  Spouse/significant other   granddaughter   Type of [place]    Additional Comments  I with ADLs      Prior Function   Level of Independence  Independent    Vocation  Retired    Leisure  Sales promotion account executive, hunting, fishing      Cognition   Overall Cognitive Status  Within Functional Limits for tasks assessed      Posture/Postural Control   Posture/Postural Control  Postural limitations    Postural Limitations  Rounded Shoulders;Forward head      ROM / Strength   AROM / PROM / Strength  AROM;Strength;PROM      AROM   Overall AROM Comments  flexion/abduction measured sitting    AROM Assessment Site  Shoulder    Right/Left Shoulder  Left    Left Shoulder Flexion  154 Degrees    Left Shoulder ABduction  152 Degrees    Left Shoulder Internal Rotation  --   FIR to T10/11 (equal to  Rt)   Left Shoulder External Rotation  --   FER to T1/2 (equal to Rt)     PROM   PROM Assessment Site  --    Right/Left Shoulder  --      Strength   Strength Assessment Site  Shoulder    Right/Left Shoulder  Left    Left Shoulder Flexion  5/5    Left Shoulder ABduction  3/5    Left Shoulder Internal Rotation  5/5    Left Shoulder External Rotation  5/5      Special Tests    Special Tests  Rotator Cuff Impingement    Rotator Cuff Impingment tests  Michel Bickers test;Empty Can test;Full Can test;Drop Arm test      Hawkins-Kennedy test   Findings  Positive    Side  Left      Empty Can test   Findings  Positive    Side  Left      Full Can test   Findings  Negative      Drop Arm test   Findings  Negative                Objective measurements completed on examination: See above findings.      [facility] Adult PT Treatment/Exercise - 03/17/19 1110      Exercises   Exercises  Shoulder      Shoulder Exercises: Sidelying   ABduction Limitations  instructed for HEP      Shoulder Exercises: Standing   ABduction  Left;5 reps;Weights    Shoulder ABduction Weight (lbs)  1    Row  Both;10 reps;Theraband    Theraband Level (Shoulder Row)  Level 3 Nyoka Cowden)             PT Education - 03/17/19 1135    Education Details  HEP    Person(s) Educated  Patient    Methods  Explanation;Demonstration;Handout    Comprehension  Verbalized understanding;Returned demonstration;Need further instruction          PT Long Term Goals - 03/17/19 1256  PT LONG TERM GOAL #1   Title  independent with HEP    Status  New    Target Date  04/28/19      PT LONG TERM GOAL #2   Title  demonstrate at least 4/5 Lt shoulder abduction strength for improved function    Status  New    Target Date  04/28/19      PT LONG TERM GOAL #3   Title  report pain <3/10 with activity for improved function    Status  New    Target Date  04/28/19             Plan - 03/17/19  1135    Clinical Impression Statement  Pt is a 84 y/o male who presents to OPPT for Lt shoulder pain following fall on 02/18/19.  Pt overall doing well with full AROM and only limitation at this time is abduction weakness with postural abnormalities so HEP given to work on strengthening and posture.  Will benefit from PT to maximize function and address deficits.    Personal Factors and Comorbidities  Comorbidity 3+    Comorbidities  HLD, HTN, DM, lumbar laminectomy    Examination-Activity Limitations  Reach Overhead;Lift    Examination-Participation Restrictions  Other   hunting, fishing   Stability/Clinical Decision Making  Stable/Uncomplicated    Clinical Decision Making  Low    Rehab Potential  Good    PT Frequency  Biweekly   will see up to 1x/wk   PT Duration  6 weeks    PT Treatment/Interventions  ADLs/Self Care Home Management;Cryotherapy;Electrical Stimulation;Ultrasound;Moist Heat;Functional mobility training;Therapeutic activities;Therapeutic exercise;Patient/family education;Manual techniques;Vasopneumatic Device;Taping;Dry needling    PT Next Visit Plan  review HEP and progress strengthening; see how he's doing and if PT still needed    PT Home Exercise Plan  Access Code: [email or web address]    Consulted and Agree with Plan of Care  Patient       Patient will benefit from skilled therapeutic intervention in order to improve the following deficits and impairments:  Decreased strength, Postural dysfunction  Visit Diagnosis: Acute pain of left shoulder - Plan: PT plan of care cert/re-cert  Muscle weakness (generalized) - Plan: PT plan of care cert/re-cert  Abnormal posture - Plan: PT plan of care cert/re-cert     Problem List Patient Active Problem List   Diagnosis Date Noted  . Contusion of left shoulder 02/23/2019  . History of lumbar laminectomy for spinal cord decompression 05/19/2015      Laureen Abrahams, PT, DPT 03/17/19 12:59 PM    [facility]  Physical Therapy [address] [place], [place], [address] Phone: [phone]   Fax:  [phone]  Name: Seth Foley MRN: [id number] Date of Birth: 11-27-1935

## 2019-03-29 ENCOUNTER — Ambulatory Visit (INDEPENDENT_AMBULATORY_CARE_PROVIDER_SITE_OTHER): Payer: Medicare Other | Admitting: Rehabilitative and Restorative Service Providers"

## 2019-03-29 ENCOUNTER — Encounter: Payer: Self-pay | Admitting: Rehabilitative and Restorative Service Providers"

## 2019-03-29 ENCOUNTER — Other Ambulatory Visit: Payer: Self-pay

## 2019-03-29 DIAGNOSIS — M25512 Pain in left shoulder: Secondary | ICD-10-CM

## 2019-03-29 DIAGNOSIS — R293 Abnormal posture: Secondary | ICD-10-CM

## 2019-03-29 DIAGNOSIS — M6281 Muscle weakness (generalized): Secondary | ICD-10-CM | POA: Diagnosis not present

## 2019-03-29 NOTE — Therapy (Signed)
[facility] Physical Therapy [address] [place], [place], [address] Phone: [phone]   Fax:  [phone]  Physical Therapy Treatment  Patient Details  Name: Seth Foley MRN: [id number] Date of Birth: 05-03-1935 Referring Provider (PT): Marybelle Killings, MD   Encounter Date: 03/29/2019  PT End of Session - 03/29/19 0853    Visit Number  2    Number of Visits  6    Date for PT Re-Evaluation  04/28/19    PT Start Time  0803    PT Stop Time  0845    PT Time Calculation (min)  42 min    Activity Tolerance  Patient tolerated treatment well    Behavior During Therapy  [facility] for tasks assessed/performed       Past Medical History:  Diagnosis Date  . BPH (benign prostatic hyperplasia)   . Chronic lower back pain   . High cholesterol   . Hypertension   . Kidney stones   . Prostate infection   . Skin cancer    "temples and scalp" (05/19/2015)  . Type II diabetes mellitus ([place])     Past Surgical History:  Procedure Laterality Date  . APPENDECTOMY    . BACK SURGERY    . COLONOSCOPY W/ POLYPECTOMY    . DECOMPRESSIVE LUMBAR LAMINECTOMY LEVEL 2  05/19/2015   L3-4, 4-5  . FRACTURE SURGERY    . HYDROCELE EXCISION    . KNEE ARTHROSCOPY Bilateral   . LITHOTRIPSY  X 2  . LUMBAR DISC SURGERY     "herniated disc"  . LUMBAR LAMINECTOMY/DECOMPRESSION MICRODISCECTOMY N/A 05/19/2015   Procedure: L3-4, L4-5 Decompression;  Surgeon: Marybelle Killings, MD;  Location: [place];  Service: Orthopedics;  Laterality: N/A;  . NASAL RECONSTRUCTION      There were no vitals filed for this visit.  Subjective Assessment - 03/29/19 0806    Subjective  Pt. indicated some pain off and on during day, and sometimes at night.   Overall feeling pretty good.    Pertinent History  HLD, HTN, DM, lumbar laminectomy    Limitations  Lifting    Patient Stated Goals  improve motion, pain    Currently in Pain?  Yes    Pain Score  4     Pain Location  Shoulder    Pain Orientation  Left    Pain  Descriptors / Indicators  Sharp    Pain Type  Acute pain    Pain Onset  1 to 4 weeks ago    Aggravating Factors   Reaching out with arm         [facility] PT Assessment - 03/29/19 0001      Assessment   Medical Diagnosis  Lt shoulder pain                   [facility] Adult PT Treatment/Exercise - 03/29/19 0001      Exercises   Exercises  Shoulder      Shoulder Exercises: Seated   Other Seated Exercises  UBE 3 mins forward, reverse each lvl 1 for ROM, muscular activation      Shoulder Exercises: Sidelying   ABduction  Left   3 x 10   ABduction Weight (lbs)  0      Shoulder Exercises: Standing   External Rotation  Left   3 x 10 green cando band   Theraband Level (Shoulder External Rotation)  Level 3 (Green)    ABduction  Left   3x 10  Shoulder ABduction Weight (lbs)  1    Row  Both;Theraband   3x 10   Theraband Level (Shoulder Row)  Level 3 (Green)    Other Standing Exercises  standing wall push up 3 x 10    Other Standing Exercises  standing 90 degree flexion ball circles at wall 30 x 2 clockwise, counter clockwise             PT Education - 03/29/19 0822    Education Details  HEP review c techniques    Person(s) Educated  Patient    Methods  Explanation;Verbal cues;Tactile cues    Comprehension  Verbalized understanding;Returned demonstration          PT Long Term Goals - 03/17/19 1256      PT LONG TERM GOAL #1   Title  independent with HEP    Status  New    Target Date  04/28/19      PT LONG TERM GOAL #2   Title  demonstrate at least 4/5 Lt shoulder abduction strength for improved function    Status  New    Target Date  04/28/19      PT LONG TERM GOAL #3   Title  report pain <3/10 with activity for improved function    Status  New    Target Date  04/28/19            Plan - 03/29/19 0824    Clinical Impression Statement  Pt. demonstrated improvement in flexion and abduction MMT today versus evaluation, with abduction mild  limitation remaining at this time.  Pt. did require moderate level cues for HEP review and techniques to ensure proper performance.    PT Next Visit Plan  Continue to review HEP, progress function lifting, reaching for daily activity.  Reassess next visit for continued visit necessity.    PT Home Exercise Plan  Access Code: [email or web address]       Patient will benefit from skilled therapeutic intervention in order to improve the following deficits and impairments:     Visit Diagnosis: Acute pain of left shoulder  Muscle weakness (generalized)  Abnormal posture     Problem List Patient Active Problem List   Diagnosis Date Noted  . Contusion of left shoulder 02/23/2019  . History of lumbar laminectomy for spinal cord decompression 05/19/2015    Scot Jun, PT, DPT, OCS, ATC 03/29/19  8:54 AM    [facility] Physical Therapy [address] [place], [place], [address] Phone: [phone]   Fax:  [phone]  Name: Seth Foley MRN: [id number] Date of Birth: 12-02-1935

## 2019-04-12 ENCOUNTER — Other Ambulatory Visit: Payer: Self-pay

## 2019-04-12 ENCOUNTER — Ambulatory Visit (INDEPENDENT_AMBULATORY_CARE_PROVIDER_SITE_OTHER): Payer: Medicare Other | Admitting: Rehabilitative and Restorative Service Providers"

## 2019-04-12 ENCOUNTER — Encounter: Payer: Self-pay | Admitting: Rehabilitative and Restorative Service Providers"

## 2019-04-12 DIAGNOSIS — M6281 Muscle weakness (generalized): Secondary | ICD-10-CM | POA: Diagnosis not present

## 2019-04-12 DIAGNOSIS — M25512 Pain in left shoulder: Secondary | ICD-10-CM

## 2019-04-12 DIAGNOSIS — R293 Abnormal posture: Secondary | ICD-10-CM | POA: Diagnosis not present

## 2019-04-12 NOTE — Therapy (Signed)
[facility] Physical Therapy [address] [place], [place], [address] Phone: [phone]   Fax:  [phone]  Physical Therapy Treatment/Discharge Summary  Patient Details  Name: Seth Foley MRN: [id number] Date of Birth: 1935/10/07 Referring Provider (PT): Marybelle Killings, MD   Encounter Date: 04/12/2019  PT End of Session - 04/12/19 0821    Visit Number  3    Number of Visits  6    Date for PT Re-Evaluation  04/28/19    PT Start Time  0807    PT Stop Time  0845    PT Time Calculation (min)  38 min    Activity Tolerance  Patient tolerated treatment well    Behavior During Therapy  [facility] for tasks assessed/performed       Past Medical History:  Diagnosis Date  . BPH (benign prostatic hyperplasia)   . Chronic lower back pain   . High cholesterol   . Hypertension   . Kidney stones   . Prostate infection   . Skin cancer    "temples and scalp" (05/19/2015)  . Type II diabetes mellitus ([place])     Past Surgical History:  Procedure Laterality Date  . APPENDECTOMY    . BACK SURGERY    . COLONOSCOPY W/ POLYPECTOMY    . DECOMPRESSIVE LUMBAR LAMINECTOMY LEVEL 2  05/19/2015   L3-4, 4-5  . FRACTURE SURGERY    . HYDROCELE EXCISION    . KNEE ARTHROSCOPY Bilateral   . LITHOTRIPSY  X 2  . LUMBAR DISC SURGERY     "herniated disc"  . LUMBAR LAMINECTOMY/DECOMPRESSION MICRODISCECTOMY N/A 05/19/2015   Procedure: L3-4, L4-5 Decompression;  Surgeon: Marybelle Killings, MD;  Location: [facility];  Service: Orthopedics;  Laterality: N/A;  . NASAL RECONSTRUCTION      There were no vitals filed for this visit.  Subjective Assessment - 04/12/19 0810    Subjective  Pt. stated feeling mild pain at most at this time.  Pt. arrived stating he thought today could be his last visit.    Pertinent History  HLD, HTN, DM, lumbar laminectomy    Patient Stated Goals  improve motion, pain    Currently in Pain?  Yes    Pain Location  Shoulder    Pain Orientation  Left    Pain Descriptors /  Indicators  Dull    Pain Type  Acute pain    Pain Onset  1 to 4 weeks ago    Pain Frequency  Occasional         [facility] PT Assessment - 04/12/19 0001      Strength   Left Shoulder Flexion  5/5    Left Shoulder ABduction  4/5    Left Shoulder Internal Rotation  5/5    Left Shoulder External Rotation  5/5                   [facility] Adult PT Treatment/Exercise - 04/12/19 0807      Shoulder Exercises: Seated   Other Seated Exercises  UBE 3 mins forward, reverse each lvl 1 for ROM, muscular activation      Shoulder Exercises: Standing   External Rotation  Left;Right   3 x 10   Theraband Level (Shoulder External Rotation)  Level 3 (Green)    Internal Rotation  Left;Right   3 x 10   Theraband Level (Shoulder Internal Rotation)  Level 3 (Green)    ABduction  Left    Row  Both;Theraband    Theraband Level (  Shoulder Row)  Level 3 (Green)    Other Standing Exercises  standing wall push up 3 x 10    Other Standing Exercises  standing 90 degree flexion ball circles at wall 30 x 2 clockwise, counter clockwise             PT Education - 04/12/19 0811    Education Details  Review of HEP, D/C instructions    Person(s) Educated  Patient    Methods  Explanation    Comprehension  Verbalized understanding          PT Long Term Goals - 04/12/19 7741      PT LONG TERM GOAL #1   Title  independent with HEP    Status  Achieved      PT LONG TERM GOAL #2   Title  demonstrate at least 4/5 Lt shoulder abduction strength for improved function    Status  Achieved      PT LONG TERM GOAL #3   Title  report pain <3/10 with activity for improved function    Status  Achieved            Plan - 04/12/19 0813    Clinical Impression Statement  Pt. has demonstrated reduced pain complaints, improved function and objective measurements to this point compared to evaluation presentation.  With such improvements, Pt. has indicated desire and demonstrated ability to transition to  HEP c return to clinic for skilled PT services prn based off symptom presentation.    Personal Factors and Comorbidities  Comorbidity 3+    Comorbidities  HLD, HTN, DM, lumbar laminectomy    Stability/Clinical Decision Making  Stable/Uncomplicated    Clinical Decision Making  Low    Rehab Potential  Good    PT Treatment/Interventions  ADLs/Self Care Home Management;Cryotherapy;Electrical Stimulation;Ultrasound;Moist Heat;Functional mobility training;Therapeutic activities;Therapeutic exercise;Patient/family education;Manual techniques;Vasopneumatic Device;Taping;Dry needling    PT Next Visit Plan  Return prn based off symptom presentation    PT Home Exercise Plan  Access Code: [email or web address]    Consulted and Agree with Plan of Care  Patient       Patient will benefit from skilled therapeutic intervention in order to improve the following deficits and impairments:     Visit Diagnosis: Acute pain of left shoulder  Muscle weakness (generalized)  Abnormal posture     Problem List Patient Active Problem List   Diagnosis Date Noted  . Contusion of left shoulder 02/23/2019  . History of lumbar laminectomy for spinal cord decompression 05/19/2015   PHYSICAL THERAPY DISCHARGE SUMMARY  Visits from Start of Care: 3  Current functional level related to goals / functional outcomes: Goals Met   Remaining deficits: None, goals met   Education / Equipment: HEP provided Plan: Patient agrees to discharge.  Patient goals were met. Patient is being discharged due to meeting the stated rehab goals.  ?????      Scot Jun, PT, DPT, OCS, ATC 04/12/19  8:46 AM    [facility] Physical Therapy [address] [place], [place], [address] Phone: [phone]   Fax:  [phone]  Name: Seth Foley MRN: [id number] Date of Birth: Sep 06, 1935

## 2019-05-24 DIAGNOSIS — E1140 Type 2 diabetes mellitus with diabetic neuropathy, unspecified: Secondary | ICD-10-CM | POA: Diagnosis not present

## 2019-05-24 DIAGNOSIS — E782 Mixed hyperlipidemia: Secondary | ICD-10-CM | POA: Diagnosis not present

## 2019-05-24 DIAGNOSIS — E119 Type 2 diabetes mellitus without complications: Secondary | ICD-10-CM | POA: Diagnosis not present

## 2019-05-24 DIAGNOSIS — I10 Essential (primary) hypertension: Secondary | ICD-10-CM | POA: Diagnosis not present

## 2019-05-24 DIAGNOSIS — E1142 Type 2 diabetes mellitus with diabetic polyneuropathy: Secondary | ICD-10-CM | POA: Diagnosis not present

## 2019-05-24 DIAGNOSIS — Z7984 Long term (current) use of oral hypoglycemic drugs: Secondary | ICD-10-CM | POA: Diagnosis not present

## 2019-07-02 DIAGNOSIS — I10 Essential (primary) hypertension: Secondary | ICD-10-CM | POA: Diagnosis not present

## 2019-07-02 DIAGNOSIS — E782 Mixed hyperlipidemia: Secondary | ICD-10-CM | POA: Diagnosis not present

## 2019-07-02 DIAGNOSIS — Z0000 Encounter for general adult medical examination without abnormal findings: Secondary | ICD-10-CM | POA: Diagnosis not present

## 2019-07-02 DIAGNOSIS — M48061 Spinal stenosis, lumbar region without neurogenic claudication: Secondary | ICD-10-CM | POA: Diagnosis not present

## 2019-07-02 DIAGNOSIS — R8290 Unspecified abnormal findings in urine: Secondary | ICD-10-CM | POA: Diagnosis not present

## 2019-07-02 DIAGNOSIS — Z7984 Long term (current) use of oral hypoglycemic drugs: Secondary | ICD-10-CM | POA: Diagnosis not present

## 2019-07-02 DIAGNOSIS — E1140 Type 2 diabetes mellitus with diabetic neuropathy, unspecified: Secondary | ICD-10-CM | POA: Diagnosis not present

## 2019-07-02 DIAGNOSIS — L570 Actinic keratosis: Secondary | ICD-10-CM | POA: Diagnosis not present

## 2019-07-21 DIAGNOSIS — Z7984 Long term (current) use of oral hypoglycemic drugs: Secondary | ICD-10-CM | POA: Diagnosis not present

## 2019-07-21 DIAGNOSIS — E119 Type 2 diabetes mellitus without complications: Secondary | ICD-10-CM | POA: Diagnosis not present

## 2019-08-09 DIAGNOSIS — E1142 Type 2 diabetes mellitus with diabetic polyneuropathy: Secondary | ICD-10-CM | POA: Diagnosis not present

## 2019-08-09 DIAGNOSIS — E782 Mixed hyperlipidemia: Secondary | ICD-10-CM | POA: Diagnosis not present

## 2019-08-09 DIAGNOSIS — E119 Type 2 diabetes mellitus without complications: Secondary | ICD-10-CM | POA: Diagnosis not present

## 2019-08-09 DIAGNOSIS — I10 Essential (primary) hypertension: Secondary | ICD-10-CM | POA: Diagnosis not present

## 2019-08-09 DIAGNOSIS — E1140 Type 2 diabetes mellitus with diabetic neuropathy, unspecified: Secondary | ICD-10-CM | POA: Diagnosis not present

## 2019-11-16 DIAGNOSIS — K219 Gastro-esophageal reflux disease without esophagitis: Secondary | ICD-10-CM | POA: Diagnosis not present

## 2019-11-16 DIAGNOSIS — E1142 Type 2 diabetes mellitus with diabetic polyneuropathy: Secondary | ICD-10-CM | POA: Diagnosis not present

## 2019-11-16 DIAGNOSIS — E119 Type 2 diabetes mellitus without complications: Secondary | ICD-10-CM | POA: Diagnosis not present

## 2019-11-16 DIAGNOSIS — I10 Essential (primary) hypertension: Secondary | ICD-10-CM | POA: Diagnosis not present

## 2019-11-16 DIAGNOSIS — E782 Mixed hyperlipidemia: Secondary | ICD-10-CM | POA: Diagnosis not present

## 2019-11-16 DIAGNOSIS — E1140 Type 2 diabetes mellitus with diabetic neuropathy, unspecified: Secondary | ICD-10-CM | POA: Diagnosis not present

## 2020-01-04 DIAGNOSIS — Z7984 Long term (current) use of oral hypoglycemic drugs: Secondary | ICD-10-CM | POA: Diagnosis not present

## 2020-01-04 DIAGNOSIS — Z0000 Encounter for general adult medical examination without abnormal findings: Secondary | ICD-10-CM | POA: Diagnosis not present

## 2020-01-04 DIAGNOSIS — E1140 Type 2 diabetes mellitus with diabetic neuropathy, unspecified: Secondary | ICD-10-CM | POA: Diagnosis not present

## 2020-01-04 DIAGNOSIS — R8290 Unspecified abnormal findings in urine: Secondary | ICD-10-CM | POA: Diagnosis not present

## 2020-01-04 DIAGNOSIS — L570 Actinic keratosis: Secondary | ICD-10-CM | POA: Diagnosis not present

## 2020-01-04 DIAGNOSIS — M48061 Spinal stenosis, lumbar region without neurogenic claudication: Secondary | ICD-10-CM | POA: Diagnosis not present

## 2020-01-04 DIAGNOSIS — E782 Mixed hyperlipidemia: Secondary | ICD-10-CM | POA: Diagnosis not present

## 2020-01-04 DIAGNOSIS — I10 Essential (primary) hypertension: Secondary | ICD-10-CM | POA: Diagnosis not present

## 2020-01-25 DIAGNOSIS — M545 Low back pain, unspecified: Secondary | ICD-10-CM | POA: Diagnosis not present

## 2020-05-04 DIAGNOSIS — E1142 Type 2 diabetes mellitus with diabetic polyneuropathy: Secondary | ICD-10-CM | POA: Diagnosis not present

## 2020-05-04 DIAGNOSIS — E119 Type 2 diabetes mellitus without complications: Secondary | ICD-10-CM | POA: Diagnosis not present

## 2020-05-04 DIAGNOSIS — I10 Essential (primary) hypertension: Secondary | ICD-10-CM | POA: Diagnosis not present

## 2020-05-04 DIAGNOSIS — K219 Gastro-esophageal reflux disease without esophagitis: Secondary | ICD-10-CM | POA: Diagnosis not present

## 2020-05-04 DIAGNOSIS — E782 Mixed hyperlipidemia: Secondary | ICD-10-CM | POA: Diagnosis not present

## 2020-05-04 DIAGNOSIS — E1140 Type 2 diabetes mellitus with diabetic neuropathy, unspecified: Secondary | ICD-10-CM | POA: Diagnosis not present

## 2020-08-16 DIAGNOSIS — U071 COVID-19: Secondary | ICD-10-CM | POA: Diagnosis not present

## 2020-08-16 DIAGNOSIS — Z6828 Body mass index (BMI) 28.0-28.9, adult: Secondary | ICD-10-CM | POA: Diagnosis not present

## 2020-09-21 DIAGNOSIS — E119 Type 2 diabetes mellitus without complications: Secondary | ICD-10-CM | POA: Diagnosis not present

## 2020-09-21 DIAGNOSIS — Z7984 Long term (current) use of oral hypoglycemic drugs: Secondary | ICD-10-CM | POA: Diagnosis not present

## 2020-11-20 DIAGNOSIS — Z1389 Encounter for screening for other disorder: Secondary | ICD-10-CM | POA: Diagnosis not present

## 2020-11-20 DIAGNOSIS — Z0000 Encounter for general adult medical examination without abnormal findings: Secondary | ICD-10-CM | POA: Diagnosis not present

## 2020-11-21 DIAGNOSIS — I10 Essential (primary) hypertension: Secondary | ICD-10-CM | POA: Diagnosis not present

## 2020-11-21 DIAGNOSIS — E1140 Type 2 diabetes mellitus with diabetic neuropathy, unspecified: Secondary | ICD-10-CM | POA: Diagnosis not present

## 2020-11-21 DIAGNOSIS — E782 Mixed hyperlipidemia: Secondary | ICD-10-CM | POA: Diagnosis not present

## 2020-11-21 DIAGNOSIS — Z23 Encounter for immunization: Secondary | ICD-10-CM | POA: Diagnosis not present

## 2021-05-24 DIAGNOSIS — I10 Essential (primary) hypertension: Secondary | ICD-10-CM | POA: Diagnosis not present

## 2021-05-24 DIAGNOSIS — J029 Acute pharyngitis, unspecified: Secondary | ICD-10-CM | POA: Diagnosis not present

## 2021-05-24 DIAGNOSIS — E1140 Type 2 diabetes mellitus with diabetic neuropathy, unspecified: Secondary | ICD-10-CM | POA: Diagnosis not present

## 2021-09-25 DIAGNOSIS — Z7984 Long term (current) use of oral hypoglycemic drugs: Secondary | ICD-10-CM | POA: Diagnosis not present

## 2021-09-25 DIAGNOSIS — E119 Type 2 diabetes mellitus without complications: Secondary | ICD-10-CM | POA: Diagnosis not present

## 2021-11-21 DIAGNOSIS — Z23 Encounter for immunization: Secondary | ICD-10-CM | POA: Diagnosis not present

## 2021-11-21 DIAGNOSIS — Z1389 Encounter for screening for other disorder: Secondary | ICD-10-CM | POA: Diagnosis not present

## 2021-11-21 DIAGNOSIS — Z0000 Encounter for general adult medical examination without abnormal findings: Secondary | ICD-10-CM | POA: Diagnosis not present

## 2021-11-21 DIAGNOSIS — Z6830 Body mass index (BMI) 30.0-30.9, adult: Secondary | ICD-10-CM | POA: Diagnosis not present

## 2021-12-21 DIAGNOSIS — E1140 Type 2 diabetes mellitus with diabetic neuropathy, unspecified: Secondary | ICD-10-CM | POA: Diagnosis not present

## 2021-12-21 DIAGNOSIS — E782 Mixed hyperlipidemia: Secondary | ICD-10-CM | POA: Diagnosis not present

## 2021-12-21 DIAGNOSIS — Z6830 Body mass index (BMI) 30.0-30.9, adult: Secondary | ICD-10-CM | POA: Diagnosis not present

## 2021-12-21 DIAGNOSIS — I10 Essential (primary) hypertension: Secondary | ICD-10-CM | POA: Diagnosis not present

## 2022-04-21 DIAGNOSIS — Z03818 Encounter for observation for suspected exposure to other biological agents ruled out: Secondary | ICD-10-CM | POA: Diagnosis not present

## 2022-04-21 DIAGNOSIS — R051 Acute cough: Secondary | ICD-10-CM | POA: Diagnosis not present

## 2022-04-21 DIAGNOSIS — J029 Acute pharyngitis, unspecified: Secondary | ICD-10-CM | POA: Diagnosis not present

## 2022-04-21 DIAGNOSIS — B349 Viral infection, unspecified: Secondary | ICD-10-CM | POA: Diagnosis not present

## 2022-04-21 DIAGNOSIS — R0981 Nasal congestion: Secondary | ICD-10-CM | POA: Diagnosis not present

## 2022-06-26 DIAGNOSIS — E782 Mixed hyperlipidemia: Secondary | ICD-10-CM | POA: Diagnosis not present

## 2022-06-26 DIAGNOSIS — E1140 Type 2 diabetes mellitus with diabetic neuropathy, unspecified: Secondary | ICD-10-CM | POA: Diagnosis not present

## 2022-06-26 DIAGNOSIS — I10 Essential (primary) hypertension: Secondary | ICD-10-CM | POA: Diagnosis not present

## 2022-06-26 DIAGNOSIS — Z23 Encounter for immunization: Secondary | ICD-10-CM | POA: Diagnosis not present

## 2022-08-03 ENCOUNTER — Other Ambulatory Visit (HOSPITAL_BASED_OUTPATIENT_CLINIC_OR_DEPARTMENT_OTHER): Payer: Self-pay

## 2022-08-03 ENCOUNTER — Emergency Department (HOSPITAL_BASED_OUTPATIENT_CLINIC_OR_DEPARTMENT_OTHER)
Admission: EM | Admit: 2022-08-03 | Discharge: 2022-08-03 | Disposition: A | Discharge status: Home / Self Care | Payer: Medicare Other | Attending: Emergency Medicine | Admitting: Emergency Medicine

## 2022-08-03 ENCOUNTER — Encounter (HOSPITAL_BASED_OUTPATIENT_CLINIC_OR_DEPARTMENT_OTHER): Payer: Self-pay | Admitting: Emergency Medicine

## 2022-08-03 ENCOUNTER — Other Ambulatory Visit: Payer: Self-pay

## 2022-08-03 DIAGNOSIS — N5089 Other specified disorders of the male genital organs: Secondary | ICD-10-CM | POA: Insufficient documentation

## 2022-08-03 DIAGNOSIS — L03311 Cellulitis of abdominal wall: Secondary | ICD-10-CM | POA: Diagnosis not present

## 2022-08-03 DIAGNOSIS — L0390 Cellulitis, unspecified: Secondary | ICD-10-CM

## 2022-08-03 MED ORDER — DOXYCYCLINE HYCLATE 100 MG PO CAPS
100.0000 mg | ORAL_CAPSULE | Freq: Two times a day (BID) | ORAL | 0 refills | Status: AC
  Filled 2022-08-03: qty 20, 10d supply, fill #0

## 2022-08-03 MED ORDER — CEPHALEXIN 500 MG PO CAPS
500.0000 mg | ORAL_CAPSULE | Freq: Four times a day (QID) | ORAL | 0 refills | Status: AC
  Filled 2022-08-03: qty 20, 5d supply, fill #0

## 2022-08-03 NOTE — ED Provider Notes (Signed)
[facility] AT [facility] Provider Note   CSN: [id number] Arrival date & time: 08/03/22  1229     History  Chief Complaint  Patient presents with   Groin Swelling    Oral B Akard is a 87 y.o. male.  87 year old male presents with red swollen area to his left testicle.  Had a small area the size of a pimple that gradually has spread to become firm and tender to the touch at the left side of his left testicle.  No drainage noted.  No pain.  No fever or chills.  No treatment use prior to arrival       Home Medications Prior to Admission medications   Medication Sig Start Date End Date Taking? Authorizing Provider  amLODipine (NORVASC) 5 MG tablet Take 5 mg by mouth daily. 12/29/18   [provider]  HYDROcodone-acetaminophen (NORCO/VICODIN) 5-325 MG tablet Take 1 tablet by mouth every 4 (four) hours as needed. One tab q4h prn pain 02/19/19   Donnetta Hutching, MD  losartan (COZAAR) 100 MG tablet Take 100 mg by mouth daily.  09/23/14   [provider]  metFORMIN (GLUCOPHAGE) 500 MG tablet Take 500 mg by mouth daily with breakfast.  10/27/14   [provider]  Polyethyl Glycol-Propyl Glycol (SYSTANE OP) Place 1 drop into both eyes daily as needed (dry eyes).    [provider]  simvastatin (ZOCOR) 20 MG tablet Take 20 mg by mouth at bedtime.  09/27/14   [provider]      Allergies    Morphine and codeine    Review of Systems   Review of Systems  All other systems reviewed and are negative.   Physical Exam Updated Vital Signs BP 134/68   Pulse 82   Temp 98.5 F (36.9 C) (Oral)   Resp 20   Ht 1.727 m (5\' 8" )   Wt 90.7 kg   SpO2 94%   BMI 30.41 kg/m  Physical Exam Vitals and nursing note reviewed.  Constitutional:      General: He is not in acute distress.    Appearance: Normal appearance. He is well-developed. He is not toxic-appearing.  HENT:     Head: Normocephalic and atraumatic.  Eyes:      General: Lids are normal.     Conjunctiva/sclera: Conjunctivae normal.     Pupils: Pupils are equal, round, and reactive to light.  Neck:     Thyroid: No thyroid mass.     Trachea: No tracheal deviation.  Cardiovascular:     Rate and Rhythm: Normal rate and regular rhythm.     Heart sounds: Normal heart sounds. No murmur heard.    No gallop.  Pulmonary:     Effort: Pulmonary effort is normal. No respiratory distress.     Breath sounds: Normal breath sounds. No stridor. No decreased breath sounds, wheezing, rhonchi or rales.  Abdominal:     General: There is no distension.     Palpations: Abdomen is soft.     Tenderness: There is no abdominal tenderness. There is no rebound.  Genitourinary:   Musculoskeletal:        General: No tenderness. Normal range of motion.     Cervical back: Normal range of motion and neck supple.  Skin:    General: Skin is warm and dry.     Findings: No abrasion or rash.  Neurological:     Mental Status: He is alert and oriented to person, place, and time. Mental status  is at baseline.     GCS: GCS eye subscore is 4. GCS verbal subscore is 5. GCS motor subscore is 6.     Cranial Nerves: No cranial nerve deficit.     Sensory: No sensory deficit.     Motor: Motor function is intact.  Psychiatric:        Attention and Perception: Attention normal.        Speech: Speech normal.        Behavior: Behavior normal.     ED Results / Procedures / Treatments   Labs (all labs ordered are listed, but only abnormal results are displayed) Labs Reviewed - No data to display  EKG None  Radiology No results found.  Procedures Procedures    Medications Ordered in ED Medications - No data to display  ED Course/ Medical Decision Making/ A&P                             Medical Decision Making  Patient with early scrotal cellulitis.  No concern for testicular torsion.  No area to be drained at this time.  Will place on doxycycline and patient to  follow-up with his doctor next week.  Return precautions given        Final Clinical Impression(s) / ED Diagnoses Final diagnoses:  None    Rx / DC Orders ED Discharge Orders     None         Lorre Nick, MD 08/03/22 1313

## 2022-08-03 NOTE — Discharge Instructions (Addendum)
Follow-up with your doctor on Monday or Tuesday for repeat exam.  Return to the hospital if you develop fever, increased swelling, or any other problems

## 2022-08-03 NOTE — ED Triage Notes (Signed)
Pt has a red/swollen area that started out as pimple about 3 days ago. Pt's wife states he also has an area on his naval. No fevers

## 2022-08-03 NOTE — ED Notes (Signed)
Pt received AVS; Educated on prescription medications and following up with PCP; patient verbalized understanding and not further question upon discharge.

## 2022-08-05 DIAGNOSIS — L02214 Cutaneous abscess of groin: Secondary | ICD-10-CM | POA: Diagnosis not present

## 2022-08-07 DIAGNOSIS — N492 Inflammatory disorders of scrotum: Secondary | ICD-10-CM | POA: Diagnosis not present

## 2022-08-08 ENCOUNTER — Telehealth: Payer: Self-pay | Admitting: *Deleted

## 2022-08-08 NOTE — Telephone Encounter (Signed)
Transition Care Management Unsuccessful Follow-up Telephone Call  Date of discharge and from where:  [facility] 08/03/2022  Attempts:  1st Attempt  Reason for unsuccessful TCM follow-up call:  Left voice message    

## 2022-08-09 ENCOUNTER — Telehealth: Payer: Self-pay | Admitting: *Deleted

## 2022-08-09 DIAGNOSIS — N492 Inflammatory disorders of scrotum: Secondary | ICD-10-CM | POA: Diagnosis not present

## 2022-08-09 NOTE — Telephone Encounter (Signed)
Transition Care Management Unsuccessful Follow-up Telephone Call  Date of discharge and from where:  [facility] 08/04/2022  Attempts:  2nd Attempt  Reason for unsuccessful TCM follow-up call:  Left voice message

## 2022-08-12 DIAGNOSIS — N492 Inflammatory disorders of scrotum: Secondary | ICD-10-CM | POA: Diagnosis not present

## 2022-12-11 DIAGNOSIS — I10 Essential (primary) hypertension: Secondary | ICD-10-CM | POA: Diagnosis not present

## 2022-12-11 DIAGNOSIS — Z0000 Encounter for general adult medical examination without abnormal findings: Secondary | ICD-10-CM | POA: Diagnosis not present

## 2022-12-11 DIAGNOSIS — G629 Polyneuropathy, unspecified: Secondary | ICD-10-CM | POA: Diagnosis not present

## 2022-12-11 DIAGNOSIS — Z23 Encounter for immunization: Secondary | ICD-10-CM | POA: Diagnosis not present

## 2022-12-11 DIAGNOSIS — E782 Mixed hyperlipidemia: Secondary | ICD-10-CM | POA: Diagnosis not present

## 2022-12-11 DIAGNOSIS — K219 Gastro-esophageal reflux disease without esophagitis: Secondary | ICD-10-CM | POA: Diagnosis not present

## 2022-12-11 DIAGNOSIS — E1140 Type 2 diabetes mellitus with diabetic neuropathy, unspecified: Secondary | ICD-10-CM | POA: Diagnosis not present

## 2023-06-19 DIAGNOSIS — E782 Mixed hyperlipidemia: Secondary | ICD-10-CM | POA: Diagnosis not present

## 2023-06-19 DIAGNOSIS — I10 Essential (primary) hypertension: Secondary | ICD-10-CM | POA: Diagnosis not present

## 2023-06-19 DIAGNOSIS — E1140 Type 2 diabetes mellitus with diabetic neuropathy, unspecified: Secondary | ICD-10-CM | POA: Diagnosis not present

## 2023-06-19 DIAGNOSIS — E1142 Type 2 diabetes mellitus with diabetic polyneuropathy: Secondary | ICD-10-CM | POA: Diagnosis not present

## 2023-06-19 DIAGNOSIS — M791 Myalgia, unspecified site: Secondary | ICD-10-CM | POA: Diagnosis not present
